# Patient Record
Sex: Male | Born: 1975
Health system: Southern US, Community
[De-identification: ages and names within clinical notes are randomized; demographics above are authoritative.]

## PROBLEM LIST (undated history)

## (undated) DIAGNOSIS — IMO0002 Reserved for concepts with insufficient information to code with codable children: Secondary | ICD-10-CM

## (undated) DIAGNOSIS — K219 Gastro-esophageal reflux disease without esophagitis: Secondary | ICD-10-CM

## (undated) HISTORY — DX: Gastro-esophageal reflux disease without esophagitis: K21.9

## (undated) HISTORY — DX: Reserved for concepts with insufficient information to code with codable children: IMO0002

---

## 2008-12-07 ENCOUNTER — Ambulatory Visit (HOSPITAL_COMMUNITY): Admission: RE | Admit: 2008-12-07 | Discharge: 2008-12-07 | Payer: Self-pay | Admitting: Plastic Surgery

## 2010-09-03 ENCOUNTER — Encounter: Payer: Self-pay | Admitting: Plastic Surgery

## 2010-11-22 LAB — BODY FLUID CULTURE
Culture: NO GROWTH
Culture: NO GROWTH

## 2013-10-16 ENCOUNTER — Other Ambulatory Visit: Payer: Self-pay | Admitting: Family

## 2013-10-16 ENCOUNTER — Ambulatory Visit (INDEPENDENT_AMBULATORY_CARE_PROVIDER_SITE_OTHER): Payer: Self-pay | Admitting: Family

## 2013-10-16 ENCOUNTER — Encounter: Payer: Self-pay | Admitting: Family

## 2013-10-16 ENCOUNTER — Telehealth: Payer: Self-pay | Admitting: Family Medicine

## 2013-10-16 VITALS — BP 104/80 | HR 69 | Ht 68.0 in | Wt 200.0 lb

## 2013-10-16 DIAGNOSIS — K219 Gastro-esophageal reflux disease without esophagitis: Secondary | ICD-10-CM

## 2013-10-16 DIAGNOSIS — R195 Other fecal abnormalities: Secondary | ICD-10-CM

## 2013-10-16 DIAGNOSIS — A088 Other specified intestinal infections: Secondary | ICD-10-CM

## 2013-10-16 DIAGNOSIS — A084 Viral intestinal infection, unspecified: Secondary | ICD-10-CM

## 2013-10-16 LAB — CBC WITH DIFFERENTIAL/PLATELET
BASOS ABS: 0 10*3/uL (ref 0.0–0.1)
Basophils Relative: 0.6 % (ref 0.0–3.0)
EOS PCT: 3.8 % (ref 0.0–5.0)
Eosinophils Absolute: 0.2 10*3/uL (ref 0.0–0.7)
HCT: 43.2 % (ref 39.0–52.0)
Hemoglobin: 14.7 g/dL (ref 13.0–17.0)
LYMPHS ABS: 1.6 10*3/uL (ref 0.7–4.0)
Lymphocytes Relative: 34.5 % (ref 12.0–46.0)
MCHC: 34 g/dL (ref 30.0–36.0)
MCV: 84.2 fl (ref 78.0–100.0)
MONOS PCT: 9.5 % (ref 3.0–12.0)
Monocytes Absolute: 0.4 10*3/uL (ref 0.1–1.0)
NEUTROS PCT: 51.6 % (ref 43.0–77.0)
Neutro Abs: 2.4 10*3/uL (ref 1.4–7.7)
PLATELETS: 261 10*3/uL (ref 150.0–400.0)
RBC: 5.13 Mil/uL (ref 4.22–5.81)
RDW: 12.8 % (ref 11.5–14.6)
WBC: 4.6 10*3/uL (ref 4.5–10.5)

## 2013-10-16 LAB — COMPREHENSIVE METABOLIC PANEL
ALBUMIN: 4.1 g/dL (ref 3.5–5.2)
ALK PHOS: 50 U/L (ref 39–117)
ALT: 38 U/L (ref 0–53)
AST: 28 U/L (ref 0–37)
BILIRUBIN TOTAL: 0.6 mg/dL (ref 0.3–1.2)
BUN: 13 mg/dL (ref 6–23)
CO2: 28 meq/L (ref 19–32)
Calcium: 9.3 mg/dL (ref 8.4–10.5)
Chloride: 104 mEq/L (ref 96–112)
Creatinine, Ser: 1.2 mg/dL (ref 0.4–1.5)
GFR: 74.27 mL/min (ref >60.00)
GLUCOSE: 97 mg/dL (ref 70–99)
POTASSIUM: 3.9 meq/L (ref 3.5–5.1)
SODIUM: 138 meq/L (ref 135–145)
TOTAL PROTEIN: 7 g/dL (ref 6.0–8.3)

## 2013-10-16 NOTE — Patient Instructions (Signed)

## 2013-10-16 NOTE — Telephone Encounter (Signed)
Spoke to pt c/o white stools x 4 days, thinks maybe gallbladder and wanted to know if labs could be done prior to appointment today. Told pt being as new pt need to be evaluated first and then labs will be ordered accordingly. Pt verbalized understanding.

## 2013-10-16 NOTE — Progress Notes (Signed)
   Subjective:    Patient ID: Austin LowensteinJonathan A Rosario, male    DOB: Apr 22, 1976, 38 y.o.   MRN: [id number]  HPI 38 year old white male, nonsmoker, the patient to the practice is in with complaints of white stools x4-5 days. He also has symptoms of nausea, low-grade fever, vomiting, diarrhea that has since resolved. Report having heartburn and indigestion that dates back to January for which he takes, 5734 and chart it's really help. Report feeling bloated after eating and drinking milk. Has a history of duodenal ulcers in the past.   Review of Systems  Constitutional: Negative.   HENT: Negative.   Respiratory: Negative.   Cardiovascular: Negative.   Gastrointestinal: Positive for nausea, vomiting, abdominal pain and abdominal distention.       White stool   Genitourinary: Negative.   Musculoskeletal: Negative.   Skin: Negative.   Allergic/Immunologic: Negative.   Neurological: Negative.   Psychiatric/Behavioral: Negative.    History reviewed. No pertinent past medical history.  History   Social History  . Marital Status: Married    Spouse Name: N/A    Number of Children: N/A  . Years of Education: N/A   Occupational History  . Not on file.   Social History Main Topics  . Smoking status: Never Smoker   . Smokeless tobacco: Not on file  . Alcohol Use: Yes  . Drug Use: No  . Sexual Activity: Not on file   Other Topics Concern  . Not on file   Social History Narrative  . No narrative on file    History reviewed. No pertinent past surgical history.  No family history on file.  Not on File  No current outpatient prescriptions on file prior to visit.   No current facility-administered medications on file prior to visit.    BP 104/80  Pulse 69  Ht 5\' 8"  (1.727 m)  Wt 200 lb (90.719 kg)  BMI 30.42 kg/m2chartj   Objective:   Physical Exam  Constitutional: He is oriented to person, place, and time. He appears well-developed and well-nourished.  HENT:  Right Ear:  External ear normal.  Left Ear: External ear normal.  Nose: Nose normal.  Mouth/Throat: Oropharynx is clear and moist.  Neck: Normal range of motion. Neck supple.  Cardiovascular: Normal rate, regular rhythm and normal heart sounds.   Pulmonary/Chest: Effort normal and breath sounds normal.  Abdominal: He exhibits distension. There is no tenderness. There is no rebound and no guarding.  Musculoskeletal: Normal range of motion.  Neurological: He is alert and oriented to person, place, and time.  Skin: Skin is warm and dry.  Psychiatric: He has a normal mood and affect.          Assessment & Plan:  Austin Rosario was seen today for stool color change.  Diagnoses and associated orders for this visit:  Nonspecific abnormal finding in stool contents - CBC with Differential - CMP  GERD (gastroesophageal reflux disease) - CBC with Differential - CMP  Viral gastroenteritis - CBC with Differential - CMP   Prilosec OTC once daily. Consider ultrasound of RUQ pending the results of the labs.

## 2013-10-16 NOTE — Progress Notes (Signed)
Pre visit review using our clinic review tool, if applicable. No additional management support is needed unless otherwise documented below in the visit note. 

## 2013-10-16 NOTE — Telephone Encounter (Signed)
Patient called in regarding GI issues and abnormal colored bowel movements. Reports he has not been seen in office yet. Transferred caller to office nurse since he has not been seen in office previously.

## 2013-10-20 ENCOUNTER — Encounter: Payer: Self-pay | Admitting: Gastroenterology

## 2013-10-23 ENCOUNTER — Ambulatory Visit (INDEPENDENT_AMBULATORY_CARE_PROVIDER_SITE_OTHER): Payer: Self-pay | Admitting: Family

## 2013-10-23 ENCOUNTER — Encounter: Payer: Self-pay | Admitting: Family

## 2013-10-23 VITALS — BP 100/60 | HR 65 | Ht 68.0 in | Wt 200.0 lb

## 2013-10-23 DIAGNOSIS — K219 Gastro-esophageal reflux disease without esophagitis: Secondary | ICD-10-CM

## 2013-10-23 NOTE — Progress Notes (Signed)
   Subjective:    Patient ID: Austin Rosario, male    DOB: 15-Jan-1976, 38 y.o.   MRN: [id number]  HPI 38 year old white male, nonsmoker is in today to establish. He was seen for an acute visit with complaint of white stools that since resolved. He followup visit gastroenterology on April 21. Reports occasional abdominal tenderness; of the pain in but overall is significantly improved as he maintains a low fat diet. Denies any heartburn indigestion currently.   Review of Systems  Constitutional: Negative.   HENT: Negative.   Respiratory: Negative.   Cardiovascular: Negative.   Gastrointestinal: Negative for abdominal pain, blood in stool and abdominal distention.  Genitourinary: Negative.   Musculoskeletal: Negative.   Skin: Negative.   Psychiatric/Behavioral: Negative.    Past Medical History  Diagnosis Date  . GERD (gastroesophageal reflux disease)   . Ulcer     History   Social History  . Marital Status: Married    Spouse Name: N/A    Number of Children: N/A  . Years of Education: N/A   Occupational History  . Not on file.   Social History Main Topics  . Smoking status: Never Smoker   . Smokeless tobacco: Not on file  . Alcohol Use: Yes  . Drug Use: No  . Sexual Activity: Not on file   Other Topics Concern  . Not on file   Social History Narrative  . No narrative on file    History reviewed. No pertinent past surgical history.  Family History  Problem Relation Age of Onset  . Breast cancer Maternal Grandmother   . Alcohol abuse Paternal Grandfather   . Colon cancer Paternal Grandfather     Not on File  No current outpatient prescriptions on file prior to visit.   No current facility-administered medications on file prior to visit.    BP 100/60  Pulse 65  Ht 5\' 8"  (1.727 m)  Wt 200 lb (90.719 kg)  BMI 30.42 kg/m2chart    Objective:   Physical Exam  Constitutional: He is oriented to person, place, and time. He appears well-developed and  well-nourished.  HENT:  Right Ear: External ear normal.  Left Ear: External ear normal.  Nose: Nose normal.  Mouth/Throat: Oropharynx is clear and moist.  Neck: Normal range of motion. Neck supple.  Cardiovascular: Normal rate, regular rhythm and normal heart sounds.   Pulmonary/Chest: Effort normal and breath sounds normal.  Abdominal: Soft. Bowel sounds are normal. There is no tenderness.  Neurological: He is alert and oriented to person, place, and time.  Skin: Skin is warm and dry.  Psychiatric: He has a normal mood and affect.          Assessment & Plan:  Austin Rosario was seen today for no specified reason.  Diagnoses and associated orders for this visit:  GERD (gastroesophageal reflux disease)   Continue low-fat diet  Offered an ultrasound of the right upper quadrant. Patient declined and would like to wait till next month when New insurance is benefits began.

## 2013-10-23 NOTE — Patient Instructions (Signed)

## 2013-10-23 NOTE — Progress Notes (Signed)
Pre visit review using our clinic review tool, if applicable. No additional management support is needed unless otherwise documented below in the visit note. 

## 2013-12-01 ENCOUNTER — Ambulatory Visit (INDEPENDENT_AMBULATORY_CARE_PROVIDER_SITE_OTHER): Payer: BC Managed Care – PPO | Admitting: Gastroenterology

## 2013-12-01 ENCOUNTER — Encounter: Payer: Self-pay | Admitting: Gastroenterology

## 2013-12-01 VITALS — BP 114/80 | HR 80 | Ht 68.0 in | Wt 199.8 lb

## 2013-12-01 DIAGNOSIS — R1013 Epigastric pain: Secondary | ICD-10-CM

## 2013-12-01 DIAGNOSIS — R109 Unspecified abdominal pain: Secondary | ICD-10-CM

## 2013-12-01 MED ORDER — ESOMEPRAZOLE MAGNESIUM 40 MG PO CPDR: DELAYED_RELEASE_CAPSULE | ORAL | Status: DC

## 2013-12-01 NOTE — Progress Notes (Signed)
    _                                                                                                                History of Present Illness: 38 year old white male referred for evaluation of abdominal and chest discomfort.  Approximately 6 weeks ago he had what sounds like a gastroenteritis characterized by nausea, vomiting and diarrhea.  This was followed by several days of a very light colored stool.  Liver tests at that time were normal.  He denied having abdominal pain.  He's had no recurrences.  At no time was he jaundiced or have dark-colored urine.  He does occasionally have gas-like pressure in his lower chest and upper abdomen which is relieved by belching.  He denies pyrosis, per se.  There's been no recent change in bowel habits, melena or hematochezia.    Past Medical History  Diagnosis Date  . GERD (gastroesophageal reflux disease)   . Ulcer    History reviewed. No pertinent past surgical history. family history includes Alcohol abuse in his paternal grandfather; Breast cancer in his maternal grandmother; Colon cancer in his paternal grandfather. Current Outpatient Prescriptions  Medication Sig Dispense Refill  . loratadine (CLARITIN) 10 MG tablet Take 10 mg by mouth daily.       No current facility-administered medications for this visit.   Allergies as of 12/01/2013  . (No Known Allergies)    reports that he has never smoked. He has never used smokeless tobacco. He reports that he drinks alcohol. He reports that he does not use illicit drugs.     Review of Systems: Pertinent positive and negative review of systems were noted in the above HPI section. All other review of systems were otherwise negative.  Vital signs were reviewed in today's medical record Physical Exam: General: Well developed , well nourished, no acute distress Skin: anicteric Head: Normocephalic and atraumatic Eyes:  sclerae anicteric, EOMI Ears: Normal auditory acuity Mouth: No  deformity or lesions Neck: Supple, no masses or thyromegaly Lungs: Clear throughout to auscultation Heart: Regular rate and rhythm; no murmurs, rubs or bruits Abdomen: Soft, non tender and non distended. No masses, hepatosplenomegaly or hernias noted. Normal Bowel sounds.  There is a movable subcutaneous 3 x 4 cm mass in the left upper quadrant (patient had liposuction previously) Rectal:deferred Musculoskeletal: Symmetrical with no gross deformities  Skin: No lesions on visible extremities Pulses:  Normal pulses noted Extremities: No clubbing, cyanosis, edema or deformities noted Neurological: Alert oriented x 4, grossly nonfocal Cervical Nodes:  No significant cervical adenopathy Inguinal Nodes: No significant inguinal adenopathy Psychological:  Alert and cooperative. Normal mood and affect  See Assessment and Plan under Problem List

## 2013-12-01 NOTE — Patient Instructions (Signed)
You have been scheduled for an abdominal [facility] of hospital) on 12/04/2013 at 9:30am. Please arrive 15 minutes prior to your appointment for registration. Make certain not to have anything to eat or drink 6 hours prior to your appointment. Should you need to reschedule your appointment, please contact radiology at [phone]. This test typically takes about 30 minutes to perform. Nexium samples

## 2013-12-01 NOTE — Assessment & Plan Note (Signed)
Patient has what best can be described as episodes of fullness and pressure in his upper abdomen and lower chest that is nonexertional and probably represents trapped gas or acid reflux.  Symptoms do not suggest biliary tract disease and LFTs at the time of his discolored stools were normal.  Recommendations #1 trial of Nexium 40 mg daily #2 should stools become discolored we will recheck LFTs #3 abdominal ultrasound

## 2013-12-04 ENCOUNTER — Ambulatory Visit (HOSPITAL_COMMUNITY)
Admission: RE | Admit: 2013-12-04 | Discharge: 2013-12-04 | Disposition: A | Discharge status: Home / Self Care | Payer: BC Managed Care – PPO | Source: Ambulatory Visit | Attending: Gastroenterology | Admitting: Gastroenterology

## 2013-12-04 DIAGNOSIS — R109 Unspecified abdominal pain: Secondary | ICD-10-CM

## 2013-12-04 NOTE — Progress Notes (Signed)
Quick Note:  Please inform the patient that ultrasound was normal and to continue current plan of action ______ 

## 2013-12-21 ENCOUNTER — Encounter: Payer: Self-pay | Admitting: Gastroenterology

## 2013-12-21 NOTE — Telephone Encounter (Signed)
error 

## 2013-12-22 ENCOUNTER — Ambulatory Visit: Payer: BC Managed Care – PPO | Admitting: Gastroenterology

## 2014-02-17 ENCOUNTER — Ambulatory Visit: Payer: BC Managed Care – PPO | Admitting: Gastroenterology

## 2014-03-08 ENCOUNTER — Other Ambulatory Visit: Payer: Self-pay | Admitting: Gastroenterology

## 2014-03-26 ENCOUNTER — Ambulatory Visit: Payer: BC Managed Care – PPO | Admitting: Family

## 2014-03-26 DIAGNOSIS — Z0289 Encounter for other administrative examinations: Secondary | ICD-10-CM

## 2014-04-28 ENCOUNTER — Ambulatory Visit: Payer: BC Managed Care – PPO | Admitting: Gastroenterology

## 2014-06-06 ENCOUNTER — Other Ambulatory Visit: Payer: Self-pay | Admitting: Gastroenterology

## 2016-10-08 DIAGNOSIS — M9902 Segmental and somatic dysfunction of thoracic region: Secondary | ICD-10-CM | POA: Diagnosis not present

## 2016-10-08 DIAGNOSIS — M9903 Segmental and somatic dysfunction of lumbar region: Secondary | ICD-10-CM | POA: Diagnosis not present

## 2016-10-08 DIAGNOSIS — M50123 Cervical disc disorder at C6-C7 level with radiculopathy: Secondary | ICD-10-CM | POA: Diagnosis not present

## 2016-10-08 DIAGNOSIS — M9901 Segmental and somatic dysfunction of cervical region: Secondary | ICD-10-CM | POA: Diagnosis not present

## 2016-11-02 DIAGNOSIS — G4733 Obstructive sleep apnea (adult) (pediatric): Secondary | ICD-10-CM | POA: Diagnosis not present

## 2016-11-20 DIAGNOSIS — G4733 Obstructive sleep apnea (adult) (pediatric): Secondary | ICD-10-CM | POA: Diagnosis not present

## 2016-11-26 DIAGNOSIS — G4733 Obstructive sleep apnea (adult) (pediatric): Secondary | ICD-10-CM | POA: Diagnosis not present

## 2016-12-07 DIAGNOSIS — M9902 Segmental and somatic dysfunction of thoracic region: Secondary | ICD-10-CM | POA: Diagnosis not present

## 2016-12-07 DIAGNOSIS — M50123 Cervical disc disorder at C6-C7 level with radiculopathy: Secondary | ICD-10-CM | POA: Diagnosis not present

## 2016-12-07 DIAGNOSIS — M9901 Segmental and somatic dysfunction of cervical region: Secondary | ICD-10-CM | POA: Diagnosis not present

## 2016-12-07 DIAGNOSIS — M9903 Segmental and somatic dysfunction of lumbar region: Secondary | ICD-10-CM | POA: Diagnosis not present

## 2017-01-21 DIAGNOSIS — H0279 Other degenerative disorders of eyelid and periocular area: Secondary | ICD-10-CM | POA: Diagnosis not present

## 2017-01-21 DIAGNOSIS — H53483 Generalized contraction of visual field, bilateral: Secondary | ICD-10-CM | POA: Diagnosis not present

## 2017-01-21 DIAGNOSIS — H02831 Dermatochalasis of right upper eyelid: Secondary | ICD-10-CM | POA: Diagnosis not present

## 2017-01-21 DIAGNOSIS — H02413 Mechanical ptosis of bilateral eyelids: Secondary | ICD-10-CM | POA: Diagnosis not present

## 2017-01-21 DIAGNOSIS — H02834 Dermatochalasis of left upper eyelid: Secondary | ICD-10-CM | POA: Diagnosis not present

## 2017-03-25 DIAGNOSIS — K219 Gastro-esophageal reflux disease without esophagitis: Secondary | ICD-10-CM | POA: Diagnosis not present

## 2017-03-25 DIAGNOSIS — J208 Acute bronchitis due to other specified organisms: Secondary | ICD-10-CM | POA: Diagnosis not present

## 2017-03-25 DIAGNOSIS — K582 Mixed irritable bowel syndrome: Secondary | ICD-10-CM | POA: Diagnosis not present

## 2017-04-11 DIAGNOSIS — R05 Cough: Secondary | ICD-10-CM | POA: Diagnosis not present

## 2017-04-11 DIAGNOSIS — J3089 Other allergic rhinitis: Secondary | ICD-10-CM | POA: Diagnosis not present

## 2017-04-11 DIAGNOSIS — J3081 Allergic rhinitis due to animal (cat) (dog) hair and dander: Secondary | ICD-10-CM | POA: Diagnosis not present

## 2017-04-11 DIAGNOSIS — Z91018 Allergy to other foods: Secondary | ICD-10-CM | POA: Diagnosis not present

## 2017-04-11 DIAGNOSIS — J301 Allergic rhinitis due to pollen: Secondary | ICD-10-CM | POA: Diagnosis not present

## 2017-04-16 DIAGNOSIS — Z91018 Allergy to other foods: Secondary | ICD-10-CM | POA: Diagnosis not present

## 2017-04-21 DIAGNOSIS — J3089 Other allergic rhinitis: Secondary | ICD-10-CM | POA: Diagnosis not present

## 2017-04-21 DIAGNOSIS — J301 Allergic rhinitis due to pollen: Secondary | ICD-10-CM | POA: Diagnosis not present

## 2017-04-21 DIAGNOSIS — J3081 Allergic rhinitis due to animal (cat) (dog) hair and dander: Secondary | ICD-10-CM | POA: Diagnosis not present

## 2017-04-22 DIAGNOSIS — K219 Gastro-esophageal reflux disease without esophagitis: Secondary | ICD-10-CM | POA: Diagnosis not present

## 2017-04-22 DIAGNOSIS — Z91018 Allergy to other foods: Secondary | ICD-10-CM | POA: Diagnosis not present

## 2017-04-22 DIAGNOSIS — E349 Endocrine disorder, unspecified: Secondary | ICD-10-CM | POA: Diagnosis not present

## 2017-04-22 DIAGNOSIS — Z0000 Encounter for general adult medical examination without abnormal findings: Secondary | ICD-10-CM | POA: Diagnosis not present

## 2017-04-22 DIAGNOSIS — E739 Lactose intolerance, unspecified: Secondary | ICD-10-CM | POA: Diagnosis not present

## 2017-04-30 DIAGNOSIS — J3089 Other allergic rhinitis: Secondary | ICD-10-CM | POA: Diagnosis not present

## 2017-04-30 DIAGNOSIS — J3081 Allergic rhinitis due to animal (cat) (dog) hair and dander: Secondary | ICD-10-CM | POA: Diagnosis not present

## 2017-04-30 DIAGNOSIS — J301 Allergic rhinitis due to pollen: Secondary | ICD-10-CM | POA: Diagnosis not present

## 2017-05-02 DIAGNOSIS — J3081 Allergic rhinitis due to animal (cat) (dog) hair and dander: Secondary | ICD-10-CM | POA: Diagnosis not present

## 2017-05-02 DIAGNOSIS — J301 Allergic rhinitis due to pollen: Secondary | ICD-10-CM | POA: Diagnosis not present

## 2017-05-02 DIAGNOSIS — J3089 Other allergic rhinitis: Secondary | ICD-10-CM | POA: Diagnosis not present

## 2017-05-08 DIAGNOSIS — J3081 Allergic rhinitis due to animal (cat) (dog) hair and dander: Secondary | ICD-10-CM | POA: Diagnosis not present

## 2017-05-08 DIAGNOSIS — J3089 Other allergic rhinitis: Secondary | ICD-10-CM | POA: Diagnosis not present

## 2017-05-08 DIAGNOSIS — J301 Allergic rhinitis due to pollen: Secondary | ICD-10-CM | POA: Diagnosis not present

## 2017-05-09 DIAGNOSIS — R194 Change in bowel habit: Secondary | ICD-10-CM | POA: Diagnosis not present

## 2017-05-14 DIAGNOSIS — J301 Allergic rhinitis due to pollen: Secondary | ICD-10-CM | POA: Diagnosis not present

## 2017-05-14 DIAGNOSIS — J3089 Other allergic rhinitis: Secondary | ICD-10-CM | POA: Diagnosis not present

## 2017-05-14 DIAGNOSIS — J3081 Allergic rhinitis due to animal (cat) (dog) hair and dander: Secondary | ICD-10-CM | POA: Diagnosis not present

## 2017-06-03 DIAGNOSIS — J301 Allergic rhinitis due to pollen: Secondary | ICD-10-CM | POA: Diagnosis not present

## 2017-06-03 DIAGNOSIS — J3081 Allergic rhinitis due to animal (cat) (dog) hair and dander: Secondary | ICD-10-CM | POA: Diagnosis not present

## 2017-06-03 DIAGNOSIS — J3089 Other allergic rhinitis: Secondary | ICD-10-CM | POA: Diagnosis not present

## 2017-06-06 DIAGNOSIS — Z302 Encounter for sterilization: Secondary | ICD-10-CM | POA: Diagnosis not present

## 2017-06-10 DIAGNOSIS — J3089 Other allergic rhinitis: Secondary | ICD-10-CM | POA: Diagnosis not present

## 2017-06-10 DIAGNOSIS — J301 Allergic rhinitis due to pollen: Secondary | ICD-10-CM | POA: Diagnosis not present

## 2017-06-10 DIAGNOSIS — J3081 Allergic rhinitis due to animal (cat) (dog) hair and dander: Secondary | ICD-10-CM | POA: Diagnosis not present

## 2017-06-17 DIAGNOSIS — J3081 Allergic rhinitis due to animal (cat) (dog) hair and dander: Secondary | ICD-10-CM | POA: Diagnosis not present

## 2017-06-17 DIAGNOSIS — J301 Allergic rhinitis due to pollen: Secondary | ICD-10-CM | POA: Diagnosis not present

## 2017-06-17 DIAGNOSIS — J3089 Other allergic rhinitis: Secondary | ICD-10-CM | POA: Diagnosis not present

## 2017-06-19 DIAGNOSIS — Z23 Encounter for immunization: Secondary | ICD-10-CM | POA: Diagnosis not present

## 2017-06-20 DIAGNOSIS — J3081 Allergic rhinitis due to animal (cat) (dog) hair and dander: Secondary | ICD-10-CM | POA: Diagnosis not present

## 2017-06-20 DIAGNOSIS — J301 Allergic rhinitis due to pollen: Secondary | ICD-10-CM | POA: Diagnosis not present

## 2017-06-20 DIAGNOSIS — J3089 Other allergic rhinitis: Secondary | ICD-10-CM | POA: Diagnosis not present

## 2017-06-25 DIAGNOSIS — J3081 Allergic rhinitis due to animal (cat) (dog) hair and dander: Secondary | ICD-10-CM | POA: Diagnosis not present

## 2017-06-25 DIAGNOSIS — J301 Allergic rhinitis due to pollen: Secondary | ICD-10-CM | POA: Diagnosis not present

## 2017-06-25 DIAGNOSIS — J3089 Other allergic rhinitis: Secondary | ICD-10-CM | POA: Diagnosis not present

## 2017-07-08 DIAGNOSIS — J3089 Other allergic rhinitis: Secondary | ICD-10-CM | POA: Diagnosis not present

## 2017-07-08 DIAGNOSIS — J301 Allergic rhinitis due to pollen: Secondary | ICD-10-CM | POA: Diagnosis not present

## 2017-07-08 DIAGNOSIS — J3081 Allergic rhinitis due to animal (cat) (dog) hair and dander: Secondary | ICD-10-CM | POA: Diagnosis not present

## 2017-07-16 DIAGNOSIS — J301 Allergic rhinitis due to pollen: Secondary | ICD-10-CM | POA: Diagnosis not present

## 2017-07-16 DIAGNOSIS — J3081 Allergic rhinitis due to animal (cat) (dog) hair and dander: Secondary | ICD-10-CM | POA: Diagnosis not present

## 2017-07-16 DIAGNOSIS — J3089 Other allergic rhinitis: Secondary | ICD-10-CM | POA: Diagnosis not present

## 2017-07-18 DIAGNOSIS — J301 Allergic rhinitis due to pollen: Secondary | ICD-10-CM | POA: Diagnosis not present

## 2017-07-18 DIAGNOSIS — J3089 Other allergic rhinitis: Secondary | ICD-10-CM | POA: Diagnosis not present

## 2017-07-18 DIAGNOSIS — J3081 Allergic rhinitis due to animal (cat) (dog) hair and dander: Secondary | ICD-10-CM | POA: Diagnosis not present

## 2017-07-23 DIAGNOSIS — J301 Allergic rhinitis due to pollen: Secondary | ICD-10-CM | POA: Diagnosis not present

## 2017-07-23 DIAGNOSIS — J3089 Other allergic rhinitis: Secondary | ICD-10-CM | POA: Diagnosis not present

## 2017-07-23 DIAGNOSIS — J3081 Allergic rhinitis due to animal (cat) (dog) hair and dander: Secondary | ICD-10-CM | POA: Diagnosis not present

## 2017-07-25 DIAGNOSIS — J3089 Other allergic rhinitis: Secondary | ICD-10-CM | POA: Diagnosis not present

## 2017-07-25 DIAGNOSIS — J301 Allergic rhinitis due to pollen: Secondary | ICD-10-CM | POA: Diagnosis not present

## 2017-07-25 DIAGNOSIS — J3081 Allergic rhinitis due to animal (cat) (dog) hair and dander: Secondary | ICD-10-CM | POA: Diagnosis not present

## 2017-07-29 DIAGNOSIS — J3081 Allergic rhinitis due to animal (cat) (dog) hair and dander: Secondary | ICD-10-CM | POA: Diagnosis not present

## 2017-07-29 DIAGNOSIS — J3089 Other allergic rhinitis: Secondary | ICD-10-CM | POA: Diagnosis not present

## 2017-07-29 DIAGNOSIS — J301 Allergic rhinitis due to pollen: Secondary | ICD-10-CM | POA: Diagnosis not present

## 2017-08-08 DIAGNOSIS — J3089 Other allergic rhinitis: Secondary | ICD-10-CM | POA: Diagnosis not present

## 2017-08-08 DIAGNOSIS — J301 Allergic rhinitis due to pollen: Secondary | ICD-10-CM | POA: Diagnosis not present

## 2017-08-08 DIAGNOSIS — J3081 Allergic rhinitis due to animal (cat) (dog) hair and dander: Secondary | ICD-10-CM | POA: Diagnosis not present

## 2017-08-15 DIAGNOSIS — J301 Allergic rhinitis due to pollen: Secondary | ICD-10-CM | POA: Diagnosis not present

## 2017-08-15 DIAGNOSIS — J3089 Other allergic rhinitis: Secondary | ICD-10-CM | POA: Diagnosis not present

## 2017-08-15 DIAGNOSIS — J3081 Allergic rhinitis due to animal (cat) (dog) hair and dander: Secondary | ICD-10-CM | POA: Diagnosis not present

## 2017-08-20 DIAGNOSIS — J3089 Other allergic rhinitis: Secondary | ICD-10-CM | POA: Diagnosis not present

## 2017-08-20 DIAGNOSIS — J3081 Allergic rhinitis due to animal (cat) (dog) hair and dander: Secondary | ICD-10-CM | POA: Diagnosis not present

## 2017-08-20 DIAGNOSIS — J301 Allergic rhinitis due to pollen: Secondary | ICD-10-CM | POA: Diagnosis not present

## 2017-08-22 DIAGNOSIS — J3081 Allergic rhinitis due to animal (cat) (dog) hair and dander: Secondary | ICD-10-CM | POA: Diagnosis not present

## 2017-08-22 DIAGNOSIS — J3089 Other allergic rhinitis: Secondary | ICD-10-CM | POA: Diagnosis not present

## 2017-08-22 DIAGNOSIS — J301 Allergic rhinitis due to pollen: Secondary | ICD-10-CM | POA: Diagnosis not present

## 2017-08-29 DIAGNOSIS — J3081 Allergic rhinitis due to animal (cat) (dog) hair and dander: Secondary | ICD-10-CM | POA: Diagnosis not present

## 2017-08-29 DIAGNOSIS — J301 Allergic rhinitis due to pollen: Secondary | ICD-10-CM | POA: Diagnosis not present

## 2017-08-29 DIAGNOSIS — J3089 Other allergic rhinitis: Secondary | ICD-10-CM | POA: Diagnosis not present

## 2017-09-04 DIAGNOSIS — J0140 Acute pansinusitis, unspecified: Secondary | ICD-10-CM | POA: Diagnosis not present

## 2017-09-12 DIAGNOSIS — J301 Allergic rhinitis due to pollen: Secondary | ICD-10-CM | POA: Diagnosis not present

## 2017-09-12 DIAGNOSIS — J3081 Allergic rhinitis due to animal (cat) (dog) hair and dander: Secondary | ICD-10-CM | POA: Diagnosis not present

## 2017-09-12 DIAGNOSIS — J3089 Other allergic rhinitis: Secondary | ICD-10-CM | POA: Diagnosis not present

## 2017-09-17 DIAGNOSIS — F411 Generalized anxiety disorder: Secondary | ICD-10-CM | POA: Diagnosis not present

## 2017-09-17 DIAGNOSIS — D751 Secondary polycythemia: Secondary | ICD-10-CM | POA: Diagnosis not present

## 2017-09-17 DIAGNOSIS — E349 Endocrine disorder, unspecified: Secondary | ICD-10-CM | POA: Diagnosis not present

## 2017-09-19 DIAGNOSIS — J301 Allergic rhinitis due to pollen: Secondary | ICD-10-CM | POA: Diagnosis not present

## 2017-09-19 DIAGNOSIS — J3081 Allergic rhinitis due to animal (cat) (dog) hair and dander: Secondary | ICD-10-CM | POA: Diagnosis not present

## 2017-09-19 DIAGNOSIS — J3089 Other allergic rhinitis: Secondary | ICD-10-CM | POA: Diagnosis not present

## 2017-09-20 DIAGNOSIS — E349 Endocrine disorder, unspecified: Secondary | ICD-10-CM | POA: Diagnosis not present

## 2017-09-24 DIAGNOSIS — J3089 Other allergic rhinitis: Secondary | ICD-10-CM | POA: Diagnosis not present

## 2017-09-24 DIAGNOSIS — J301 Allergic rhinitis due to pollen: Secondary | ICD-10-CM | POA: Diagnosis not present

## 2017-09-24 DIAGNOSIS — J3081 Allergic rhinitis due to animal (cat) (dog) hair and dander: Secondary | ICD-10-CM | POA: Diagnosis not present

## 2017-10-01 DIAGNOSIS — J3089 Other allergic rhinitis: Secondary | ICD-10-CM | POA: Diagnosis not present

## 2017-10-01 DIAGNOSIS — J3081 Allergic rhinitis due to animal (cat) (dog) hair and dander: Secondary | ICD-10-CM | POA: Diagnosis not present

## 2017-10-01 DIAGNOSIS — J301 Allergic rhinitis due to pollen: Secondary | ICD-10-CM | POA: Diagnosis not present

## 2017-10-09 DIAGNOSIS — J301 Allergic rhinitis due to pollen: Secondary | ICD-10-CM | POA: Diagnosis not present

## 2017-10-09 DIAGNOSIS — J3089 Other allergic rhinitis: Secondary | ICD-10-CM | POA: Diagnosis not present

## 2017-10-09 DIAGNOSIS — J3081 Allergic rhinitis due to animal (cat) (dog) hair and dander: Secondary | ICD-10-CM | POA: Diagnosis not present

## 2017-10-10 DIAGNOSIS — R1084 Generalized abdominal pain: Secondary | ICD-10-CM | POA: Diagnosis not present

## 2017-10-10 DIAGNOSIS — K589 Irritable bowel syndrome without diarrhea: Secondary | ICD-10-CM | POA: Diagnosis not present

## 2017-10-16 DIAGNOSIS — J301 Allergic rhinitis due to pollen: Secondary | ICD-10-CM | POA: Diagnosis not present

## 2017-10-16 DIAGNOSIS — J3089 Other allergic rhinitis: Secondary | ICD-10-CM | POA: Diagnosis not present

## 2017-10-16 DIAGNOSIS — J3081 Allergic rhinitis due to animal (cat) (dog) hair and dander: Secondary | ICD-10-CM | POA: Diagnosis not present

## 2017-10-18 DIAGNOSIS — F411 Generalized anxiety disorder: Secondary | ICD-10-CM | POA: Diagnosis not present

## 2017-10-24 DIAGNOSIS — J0100 Acute maxillary sinusitis, unspecified: Secondary | ICD-10-CM | POA: Diagnosis not present

## 2017-10-29 DIAGNOSIS — J3089 Other allergic rhinitis: Secondary | ICD-10-CM | POA: Diagnosis not present

## 2017-10-29 DIAGNOSIS — J301 Allergic rhinitis due to pollen: Secondary | ICD-10-CM | POA: Diagnosis not present

## 2017-10-29 DIAGNOSIS — J3081 Allergic rhinitis due to animal (cat) (dog) hair and dander: Secondary | ICD-10-CM | POA: Diagnosis not present

## 2017-11-04 DIAGNOSIS — D485 Neoplasm of uncertain behavior of skin: Secondary | ICD-10-CM | POA: Diagnosis not present

## 2017-11-04 DIAGNOSIS — L570 Actinic keratosis: Secondary | ICD-10-CM | POA: Diagnosis not present

## 2017-11-04 DIAGNOSIS — B001 Herpesviral vesicular dermatitis: Secondary | ICD-10-CM | POA: Diagnosis not present

## 2017-11-07 DIAGNOSIS — J301 Allergic rhinitis due to pollen: Secondary | ICD-10-CM | POA: Diagnosis not present

## 2017-11-07 DIAGNOSIS — J3081 Allergic rhinitis due to animal (cat) (dog) hair and dander: Secondary | ICD-10-CM | POA: Diagnosis not present

## 2017-11-07 DIAGNOSIS — J3089 Other allergic rhinitis: Secondary | ICD-10-CM | POA: Diagnosis not present

## 2017-11-13 DIAGNOSIS — J301 Allergic rhinitis due to pollen: Secondary | ICD-10-CM | POA: Diagnosis not present

## 2017-11-13 DIAGNOSIS — J3081 Allergic rhinitis due to animal (cat) (dog) hair and dander: Secondary | ICD-10-CM | POA: Diagnosis not present

## 2017-11-13 DIAGNOSIS — J3089 Other allergic rhinitis: Secondary | ICD-10-CM | POA: Diagnosis not present

## 2017-11-18 DIAGNOSIS — Z91018 Allergy to other foods: Secondary | ICD-10-CM | POA: Diagnosis not present

## 2017-11-19 DIAGNOSIS — M25512 Pain in left shoulder: Secondary | ICD-10-CM | POA: Diagnosis not present

## 2017-11-20 ENCOUNTER — Ambulatory Visit (INDEPENDENT_AMBULATORY_CARE_PROVIDER_SITE_OTHER): Payer: BLUE CROSS/BLUE SHIELD

## 2017-11-20 ENCOUNTER — Ambulatory Visit (INDEPENDENT_AMBULATORY_CARE_PROVIDER_SITE_OTHER): Payer: BLUE CROSS/BLUE SHIELD | Admitting: Orthopaedic Surgery

## 2017-11-20 ENCOUNTER — Encounter (INDEPENDENT_AMBULATORY_CARE_PROVIDER_SITE_OTHER): Payer: Self-pay | Admitting: Orthopaedic Surgery

## 2017-11-20 DIAGNOSIS — M25512 Pain in left shoulder: Secondary | ICD-10-CM

## 2017-11-20 MED ORDER — LIDOCAINE HCL 1 % IJ SOLN
3.0000 mL | INTRAMUSCULAR | Status: AC | PRN
  Administered 2017-11-20: 3 mL

## 2017-11-20 MED ORDER — METHYLPREDNISOLONE ACETATE 40 MG/ML IJ SUSP
40.0000 mg | INTRAMUSCULAR | Status: AC | PRN
  Administered 2017-11-20: 40 mg via INTRA_ARTICULAR

## 2017-11-20 NOTE — Progress Notes (Signed)
Office Visit Note   Patient: Austin Rosario           Date of Birth: [birth year]           MRN: [id number] Visit Date: 11/20/2017              Requested by: Eulis FosterWebb, Padonda B, FNP [address] [place], [place] [id number] PCP: Eulis FosterWebb, Padonda B, FNP   Assessment & Plan: Visit Diagnoses:  1. Acute pain of left shoulder     Plan: I talked to DamonJonathan in length in detail.  I need him to refrain from any heavy lifting and overhead activities for the next week to 2 weeks.  Also talked about a subacromial steroid injection in his shoulder on the left side and he agreed with this.  I explained the risk benefits injections as well.  With the lidocaine and steroid in his shoulder he was able to more easily abducted after this but I do since there is some weakness.  If after 2 weeks is not getting significant relief I like him to call because I would definitely want to obtain an MRI of his left shoulder rotator cuff tear.  He is actually going to see a physical therapist tomorrow for his shoulder as well.  All questions concerns were answered and addressed.  Follow-Up Instructions: Return if symptoms worsen or fail to improve.   Orders:  Orders Placed This Encounter  Procedures  . Large Joint Inj  . XR Shoulder Left   No orders of the defined types were placed in this encounter.     Procedures: Large Joint Inj: L subacromial bursa on 11/20/2017 10:02 AM Indications: pain and diagnostic evaluation Details: 22 G 1.5 in needle  Arthrogram: No  Medications: 3 mL lidocaine 1 %; 40 mg methylPREDNISolone acetate 40 MG/ML Outcome: tolerated well, no immediate complications Procedure, treatment alternatives, risks and benefits explained, specific risks discussed. Consent was given by the patient. Immediately prior to procedure a time out was called to verify the correct patient, procedure, equipment, support staff and site/side marked as required. Patient was prepped and draped in the usual  sterile fashion.       Clinical Data: No additional findings.   Subjective: Chief Complaint  Patient presents with  . Left Shoulder - Pain  Christiane HaJonathan comes in today with severe left acute shoulder pain.  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  He was bench pressing around 300 pounds and felt a pop in his shoulder.  He hurts in the anterior shoulder and is been very painful to abduct and lift his arm above his head since then.  He says if he just relaxes he does not have any pain at all.  He denies any pain in the chest around the pec muscle areas.  He denies any previous shoulder injury.  He denies any neck pain or numbness and tingling down his left upper extremity.  HPI  Review of Systems He currently denies any headache, chest pain, shortness of breath, fever, chills, nausea, vomiting.  Objective: Vital Signs: There were no vitals taken for this visit.  Physical Exam He is alert and oriented x3 and in no acute distress Ortho Exam Examination of his left shoulder shows a negative liftoff.  He has a significant amount of pain with abduction and pain around his deltoid muscles.  His range of motion is full.  There is no pain around the pectoralis area.  There is no bruising  or atrophy.  Specialty Comments:  No specialty comments available.  Imaging: Xr Shoulder Left  Result Date: 11/20/2017 3 views of the left shoulder show well located shoulder with no acute findings.    PMFS History: Patient Active Problem List   Diagnosis Date Noted  . Abdominal pain, epigastric 12/01/2013   Past Medical History:  Diagnosis Date  . GERD (gastroesophageal reflux disease)   . Ulcer     Family History  Problem Relation Age of Onset  . Breast cancer Maternal Grandmother   . Alcohol abuse Paternal Grandfather   . Colon cancer Paternal Grandfather     History reviewed. No pertinent surgical history. Social History   Occupational History    Employer: Manufacturing engineer    Tobacco Use  . Smoking status: Never Smoker  . Smokeless tobacco: Never Used  Substance and Sexual Activity  . Alcohol use: Yes  . Drug use: No  . Sexual activity: Not on file

## 2017-11-21 DIAGNOSIS — M25512 Pain in left shoulder: Secondary | ICD-10-CM | POA: Diagnosis not present

## 2017-11-25 DIAGNOSIS — J3089 Other allergic rhinitis: Secondary | ICD-10-CM | POA: Diagnosis not present

## 2017-11-25 DIAGNOSIS — M25512 Pain in left shoulder: Secondary | ICD-10-CM | POA: Diagnosis not present

## 2017-11-25 DIAGNOSIS — J301 Allergic rhinitis due to pollen: Secondary | ICD-10-CM | POA: Diagnosis not present

## 2017-11-25 DIAGNOSIS — J3081 Allergic rhinitis due to animal (cat) (dog) hair and dander: Secondary | ICD-10-CM | POA: Diagnosis not present

## 2017-11-27 DIAGNOSIS — M25512 Pain in left shoulder: Secondary | ICD-10-CM | POA: Diagnosis not present

## 2017-12-12 DIAGNOSIS — J3081 Allergic rhinitis due to animal (cat) (dog) hair and dander: Secondary | ICD-10-CM | POA: Diagnosis not present

## 2017-12-12 DIAGNOSIS — J301 Allergic rhinitis due to pollen: Secondary | ICD-10-CM | POA: Diagnosis not present

## 2017-12-12 DIAGNOSIS — J3089 Other allergic rhinitis: Secondary | ICD-10-CM | POA: Diagnosis not present

## 2017-12-19 DIAGNOSIS — J3081 Allergic rhinitis due to animal (cat) (dog) hair and dander: Secondary | ICD-10-CM | POA: Diagnosis not present

## 2017-12-19 DIAGNOSIS — J3089 Other allergic rhinitis: Secondary | ICD-10-CM | POA: Diagnosis not present

## 2017-12-19 DIAGNOSIS — J301 Allergic rhinitis due to pollen: Secondary | ICD-10-CM | POA: Diagnosis not present

## 2017-12-24 DIAGNOSIS — J301 Allergic rhinitis due to pollen: Secondary | ICD-10-CM | POA: Diagnosis not present

## 2017-12-24 DIAGNOSIS — J3081 Allergic rhinitis due to animal (cat) (dog) hair and dander: Secondary | ICD-10-CM | POA: Diagnosis not present

## 2017-12-24 DIAGNOSIS — J3089 Other allergic rhinitis: Secondary | ICD-10-CM | POA: Diagnosis not present

## 2018-01-01 DIAGNOSIS — J301 Allergic rhinitis due to pollen: Secondary | ICD-10-CM | POA: Diagnosis not present

## 2018-01-01 DIAGNOSIS — J3089 Other allergic rhinitis: Secondary | ICD-10-CM | POA: Diagnosis not present

## 2018-01-01 DIAGNOSIS — J3081 Allergic rhinitis due to animal (cat) (dog) hair and dander: Secondary | ICD-10-CM | POA: Diagnosis not present

## 2018-01-02 DIAGNOSIS — J3081 Allergic rhinitis due to animal (cat) (dog) hair and dander: Secondary | ICD-10-CM | POA: Diagnosis not present

## 2018-01-02 DIAGNOSIS — J301 Allergic rhinitis due to pollen: Secondary | ICD-10-CM | POA: Diagnosis not present

## 2018-01-02 DIAGNOSIS — R05 Cough: Secondary | ICD-10-CM | POA: Diagnosis not present

## 2018-01-02 DIAGNOSIS — J3089 Other allergic rhinitis: Secondary | ICD-10-CM | POA: Diagnosis not present

## 2018-01-02 DIAGNOSIS — K589 Irritable bowel syndrome without diarrhea: Secondary | ICD-10-CM | POA: Diagnosis not present

## 2018-01-02 DIAGNOSIS — Z91018 Allergy to other foods: Secondary | ICD-10-CM | POA: Diagnosis not present

## 2018-01-09 DIAGNOSIS — J3081 Allergic rhinitis due to animal (cat) (dog) hair and dander: Secondary | ICD-10-CM | POA: Diagnosis not present

## 2018-01-09 DIAGNOSIS — J3089 Other allergic rhinitis: Secondary | ICD-10-CM | POA: Diagnosis not present

## 2018-01-09 DIAGNOSIS — J301 Allergic rhinitis due to pollen: Secondary | ICD-10-CM | POA: Diagnosis not present

## 2018-01-16 DIAGNOSIS — J3089 Other allergic rhinitis: Secondary | ICD-10-CM | POA: Diagnosis not present

## 2018-01-16 DIAGNOSIS — J3081 Allergic rhinitis due to animal (cat) (dog) hair and dander: Secondary | ICD-10-CM | POA: Diagnosis not present

## 2018-01-16 DIAGNOSIS — J301 Allergic rhinitis due to pollen: Secondary | ICD-10-CM | POA: Diagnosis not present

## 2018-01-23 DIAGNOSIS — J301 Allergic rhinitis due to pollen: Secondary | ICD-10-CM | POA: Diagnosis not present

## 2018-01-23 DIAGNOSIS — J3081 Allergic rhinitis due to animal (cat) (dog) hair and dander: Secondary | ICD-10-CM | POA: Diagnosis not present

## 2018-01-23 DIAGNOSIS — J3089 Other allergic rhinitis: Secondary | ICD-10-CM | POA: Diagnosis not present

## 2018-01-29 DIAGNOSIS — K582 Mixed irritable bowel syndrome: Secondary | ICD-10-CM | POA: Diagnosis not present

## 2018-01-30 DIAGNOSIS — J301 Allergic rhinitis due to pollen: Secondary | ICD-10-CM | POA: Diagnosis not present

## 2018-01-30 DIAGNOSIS — J3089 Other allergic rhinitis: Secondary | ICD-10-CM | POA: Diagnosis not present

## 2018-01-30 DIAGNOSIS — J3081 Allergic rhinitis due to animal (cat) (dog) hair and dander: Secondary | ICD-10-CM | POA: Diagnosis not present

## 2018-02-11 ENCOUNTER — Telehealth (INDEPENDENT_AMBULATORY_CARE_PROVIDER_SITE_OTHER): Payer: Self-pay | Admitting: Orthopaedic Surgery

## 2018-02-11 ENCOUNTER — Other Ambulatory Visit (INDEPENDENT_AMBULATORY_CARE_PROVIDER_SITE_OTHER): Payer: Self-pay

## 2018-02-11 DIAGNOSIS — M25512 Pain in left shoulder: Secondary | ICD-10-CM

## 2018-02-11 NOTE — Telephone Encounter (Signed)
Patient called asked if he can be set up for an MRI. Patient said his left shoulder is still having pain in it. The number to contact patient is [phone]

## 2018-02-11 NOTE — Telephone Encounter (Signed)
Pleaser call patient on [phone]

## 2018-02-11 NOTE — Telephone Encounter (Signed)
Put in order

## 2018-02-11 NOTE — Telephone Encounter (Signed)
Patient called to state that he was told to call if he did feel any better to get a MRI scheduled per -Dr. Magnus IvanBlackman.  Please call patient to advise.

## 2018-02-12 DIAGNOSIS — J3089 Other allergic rhinitis: Secondary | ICD-10-CM | POA: Diagnosis not present

## 2018-02-12 DIAGNOSIS — J3081 Allergic rhinitis due to animal (cat) (dog) hair and dander: Secondary | ICD-10-CM | POA: Diagnosis not present

## 2018-02-12 DIAGNOSIS — J301 Allergic rhinitis due to pollen: Secondary | ICD-10-CM | POA: Diagnosis not present

## 2018-02-19 ENCOUNTER — Other Ambulatory Visit (INDEPENDENT_AMBULATORY_CARE_PROVIDER_SITE_OTHER): Payer: Self-pay

## 2018-02-19 ENCOUNTER — Telehealth (INDEPENDENT_AMBULATORY_CARE_PROVIDER_SITE_OTHER): Payer: Self-pay

## 2018-02-19 NOTE — Telephone Encounter (Signed)
Dr. Magnus IvanBlackman asks if we can check on this patients MRI

## 2018-02-27 ENCOUNTER — Ambulatory Visit
Admission: RE | Admit: 2018-02-27 | Discharge: 2018-02-27 | Disposition: A | Discharge status: Home / Self Care | Payer: BLUE CROSS/BLUE SHIELD | Source: Ambulatory Visit | Attending: Orthopaedic Surgery | Admitting: Orthopaedic Surgery

## 2018-02-27 DIAGNOSIS — J3089 Other allergic rhinitis: Secondary | ICD-10-CM | POA: Diagnosis not present

## 2018-02-27 DIAGNOSIS — S46012A Strain of muscle(s) and tendon(s) of the rotator cuff of left shoulder, initial encounter: Secondary | ICD-10-CM | POA: Diagnosis not present

## 2018-02-27 DIAGNOSIS — M25512 Pain in left shoulder: Secondary | ICD-10-CM

## 2018-02-27 DIAGNOSIS — J3081 Allergic rhinitis due to animal (cat) (dog) hair and dander: Secondary | ICD-10-CM | POA: Diagnosis not present

## 2018-02-27 DIAGNOSIS — J301 Allergic rhinitis due to pollen: Secondary | ICD-10-CM | POA: Diagnosis not present

## 2018-03-05 ENCOUNTER — Ambulatory Visit (INDEPENDENT_AMBULATORY_CARE_PROVIDER_SITE_OTHER): Payer: BLUE CROSS/BLUE SHIELD | Admitting: Orthopaedic Surgery

## 2018-03-05 ENCOUNTER — Encounter (INDEPENDENT_AMBULATORY_CARE_PROVIDER_SITE_OTHER): Payer: Self-pay | Admitting: Orthopaedic Surgery

## 2018-03-05 DIAGNOSIS — S46012A Strain of muscle(s) and tendon(s) of the rotator cuff of left shoulder, initial encounter: Secondary | ICD-10-CM | POA: Diagnosis not present

## 2018-03-05 NOTE — Progress Notes (Signed)
The patient is very well-known to me.  He is very active 42 year old gentleman with no previous shoulder issues who injured his left shoulder while weightlifting back in April of this year.  He did feel a pop in his shoulder.  He has had weakness as well with external rotation and some abduction.  I did try conservative treatment with activity modification as well as anti-inflammatories and a steroid injection.  After continued pain and weakness we did significant MRI of his left shoulder and is here for review this today.  On exam he has excellent range of motion of his left shoulder but definitely some weakness of the rotator cuff itself.  The MRI is reviewed with him and it does show a full-thickness and retracted rotator cuff tear involving supraspinatus tendon.  There is associated bursitis with fluid in the subacromial subdeltoid area of the shoulder.  His acromion is type I and there is only mild AC joint arthritis.  His biceps tendon is intact.  I shared with him his MRI findings.  Given his young age and the extent of this full-thickness rotator cuff tear of the left shoulder we are recommending an arthroscopic rotator cuff repair.  I went over his MRI with him in detail.  I showed him a shoulder model we talked about surgery and this recommendation.  I talked about his intraoperative and postoperative course and the need for therapy following this.  Of note he is already had physical therapy in the shoulder as well.  He understands what the recovery will be in the time that it takes to get over shoulder issue such as this.  We did talk about the risk of surgery as well.  He would like to have this done after the Labor Day holiday.  I agree as well.  All questions were answered and addressed.  He does have my personal number to call if there is any issues prior to then or any other questions that he needs answered.  We would then see him back in 1 week postoperative for suture removal.

## 2018-03-06 DIAGNOSIS — J3081 Allergic rhinitis due to animal (cat) (dog) hair and dander: Secondary | ICD-10-CM | POA: Diagnosis not present

## 2018-03-06 DIAGNOSIS — J301 Allergic rhinitis due to pollen: Secondary | ICD-10-CM | POA: Diagnosis not present

## 2018-03-06 DIAGNOSIS — J3089 Other allergic rhinitis: Secondary | ICD-10-CM | POA: Diagnosis not present

## 2018-03-10 DIAGNOSIS — J301 Allergic rhinitis due to pollen: Secondary | ICD-10-CM | POA: Diagnosis not present

## 2018-03-10 DIAGNOSIS — J3089 Other allergic rhinitis: Secondary | ICD-10-CM | POA: Diagnosis not present

## 2018-03-10 DIAGNOSIS — J3081 Allergic rhinitis due to animal (cat) (dog) hair and dander: Secondary | ICD-10-CM | POA: Diagnosis not present

## 2018-03-18 ENCOUNTER — Telehealth (INDEPENDENT_AMBULATORY_CARE_PROVIDER_SITE_OTHER): Payer: Self-pay | Admitting: Orthopaedic Surgery

## 2018-03-19 NOTE — Telephone Encounter (Signed)
Error

## 2018-03-20 DIAGNOSIS — K598 Other specified functional intestinal disorders: Secondary | ICD-10-CM | POA: Diagnosis not present

## 2018-03-20 DIAGNOSIS — J301 Allergic rhinitis due to pollen: Secondary | ICD-10-CM | POA: Diagnosis not present

## 2018-03-20 DIAGNOSIS — J3089 Other allergic rhinitis: Secondary | ICD-10-CM | POA: Diagnosis not present

## 2018-03-20 DIAGNOSIS — R140 Abdominal distension (gaseous): Secondary | ICD-10-CM | POA: Diagnosis not present

## 2018-03-20 DIAGNOSIS — J3081 Allergic rhinitis due to animal (cat) (dog) hair and dander: Secondary | ICD-10-CM | POA: Diagnosis not present

## 2018-04-03 DIAGNOSIS — J3089 Other allergic rhinitis: Secondary | ICD-10-CM | POA: Diagnosis not present

## 2018-04-03 DIAGNOSIS — J3081 Allergic rhinitis due to animal (cat) (dog) hair and dander: Secondary | ICD-10-CM | POA: Diagnosis not present

## 2018-04-03 DIAGNOSIS — J301 Allergic rhinitis due to pollen: Secondary | ICD-10-CM | POA: Diagnosis not present

## 2018-04-21 DIAGNOSIS — J3081 Allergic rhinitis due to animal (cat) (dog) hair and dander: Secondary | ICD-10-CM | POA: Diagnosis not present

## 2018-04-21 DIAGNOSIS — J3089 Other allergic rhinitis: Secondary | ICD-10-CM | POA: Diagnosis not present

## 2018-04-21 DIAGNOSIS — E349 Endocrine disorder, unspecified: Secondary | ICD-10-CM | POA: Diagnosis not present

## 2018-04-21 DIAGNOSIS — F411 Generalized anxiety disorder: Secondary | ICD-10-CM | POA: Diagnosis not present

## 2018-04-21 DIAGNOSIS — J301 Allergic rhinitis due to pollen: Secondary | ICD-10-CM | POA: Diagnosis not present

## 2018-04-23 DIAGNOSIS — E349 Endocrine disorder, unspecified: Secondary | ICD-10-CM | POA: Diagnosis not present

## 2018-04-24 DIAGNOSIS — S46012A Strain of muscle(s) and tendon(s) of the rotator cuff of left shoulder, initial encounter: Secondary | ICD-10-CM | POA: Diagnosis not present

## 2018-04-24 DIAGNOSIS — G8918 Other acute postprocedural pain: Secondary | ICD-10-CM | POA: Diagnosis not present

## 2018-04-24 DIAGNOSIS — Y999 Unspecified external cause status: Secondary | ICD-10-CM | POA: Diagnosis not present

## 2018-04-24 DIAGNOSIS — X58XXXA Exposure to other specified factors, initial encounter: Secondary | ICD-10-CM | POA: Diagnosis not present

## 2018-04-24 DIAGNOSIS — M75122 Complete rotator cuff tear or rupture of left shoulder, not specified as traumatic: Secondary | ICD-10-CM | POA: Diagnosis not present

## 2018-05-01 ENCOUNTER — Ambulatory Visit (INDEPENDENT_AMBULATORY_CARE_PROVIDER_SITE_OTHER): Payer: BLUE CROSS/BLUE SHIELD | Admitting: Physician Assistant

## 2018-05-01 ENCOUNTER — Encounter (INDEPENDENT_AMBULATORY_CARE_PROVIDER_SITE_OTHER): Payer: Self-pay | Admitting: Physician Assistant

## 2018-05-01 DIAGNOSIS — Z9889 Other specified postprocedural states: Secondary | ICD-10-CM | POA: Insufficient documentation

## 2018-05-01 NOTE — Progress Notes (Signed)
[name] returns today status post left shoulder arthroscopy with arthroscopic rotator cuff repair.  He is overall doing well.  No fevers chills.  He still has some pain in the shoulder.  He is not wearing a sling today.  Physical exam: General well-developed well-nourished male in no acute distress.  Left shoulder port sites are well approximated with nylon sutures no signs of infection.  He has good range of motion elbow wrist and hand.  Impression: Status post left shoulder arthroscopy with arthroscopic rotator cuff repair  Plan: Sutures were removed today.  Dr. Magnus Rosario wrote a prescription for him to work with a personal trainer physical therapist he is to go slow in regards to his shoulder motion.  No active abduction or overhead activity.  See him back in 1 month sooner if there is any questions or concerns.

## 2018-05-12 DIAGNOSIS — M25512 Pain in left shoulder: Secondary | ICD-10-CM | POA: Diagnosis not present

## 2018-05-13 DIAGNOSIS — J3081 Allergic rhinitis due to animal (cat) (dog) hair and dander: Secondary | ICD-10-CM | POA: Diagnosis not present

## 2018-05-13 DIAGNOSIS — J3089 Other allergic rhinitis: Secondary | ICD-10-CM | POA: Diagnosis not present

## 2018-05-13 DIAGNOSIS — J301 Allergic rhinitis due to pollen: Secondary | ICD-10-CM | POA: Diagnosis not present

## 2018-05-16 DIAGNOSIS — M25512 Pain in left shoulder: Secondary | ICD-10-CM | POA: Diagnosis not present

## 2018-05-21 DIAGNOSIS — M25512 Pain in left shoulder: Secondary | ICD-10-CM | POA: Diagnosis not present

## 2018-05-23 DIAGNOSIS — Z91018 Allergy to other foods: Secondary | ICD-10-CM | POA: Diagnosis not present

## 2018-05-28 ENCOUNTER — Encounter (INDEPENDENT_AMBULATORY_CARE_PROVIDER_SITE_OTHER): Payer: Self-pay | Admitting: Physician Assistant

## 2018-05-28 ENCOUNTER — Ambulatory Visit (INDEPENDENT_AMBULATORY_CARE_PROVIDER_SITE_OTHER): Payer: BLUE CROSS/BLUE SHIELD | Admitting: Physician Assistant

## 2018-05-28 DIAGNOSIS — J3089 Other allergic rhinitis: Secondary | ICD-10-CM | POA: Diagnosis not present

## 2018-05-28 DIAGNOSIS — J3081 Allergic rhinitis due to animal (cat) (dog) hair and dander: Secondary | ICD-10-CM | POA: Diagnosis not present

## 2018-05-28 DIAGNOSIS — Z9889 Other specified postprocedural states: Secondary | ICD-10-CM

## 2018-05-28 DIAGNOSIS — J301 Allergic rhinitis due to pollen: Secondary | ICD-10-CM | POA: Diagnosis not present

## 2018-05-28 NOTE — Progress Notes (Signed)
HPI: [name] returns now 4 weeks status post left rotator cuff repair.  He states overall he is doing well.  He is been doing physical therapy no active exercises beyond 90 degrees.  Taking no pain medications.  He states that he is able to drive his back now without pain he was having pain when drying his back before due to the extreme internal rotation of the left shoulder with this activity.  Physical exam: Left shoulder passive range of motion Painful forward flexion.  Negative impingement testing.  Overall good cuff strength.  Impression: 4 weeks status post left rotator cuff repair  Plan: He will continue to work on range of motion and strength with physical therapy per protocol.  Follow-up with [place] in 4 weeks check his progress lack of.  Questions were encouraged and answered.

## 2018-05-30 DIAGNOSIS — M25512 Pain in left shoulder: Secondary | ICD-10-CM | POA: Diagnosis not present

## 2018-06-06 DIAGNOSIS — M25512 Pain in left shoulder: Secondary | ICD-10-CM | POA: Diagnosis not present

## 2018-06-13 DIAGNOSIS — Z4789 Encounter for other orthopedic aftercare: Secondary | ICD-10-CM | POA: Diagnosis not present

## 2018-06-19 DIAGNOSIS — J3089 Other allergic rhinitis: Secondary | ICD-10-CM | POA: Diagnosis not present

## 2018-06-19 DIAGNOSIS — J301 Allergic rhinitis due to pollen: Secondary | ICD-10-CM | POA: Diagnosis not present

## 2018-06-19 DIAGNOSIS — J3081 Allergic rhinitis due to animal (cat) (dog) hair and dander: Secondary | ICD-10-CM | POA: Diagnosis not present

## 2018-06-20 DIAGNOSIS — Z4789 Encounter for other orthopedic aftercare: Secondary | ICD-10-CM | POA: Diagnosis not present

## 2018-06-25 DIAGNOSIS — Z4789 Encounter for other orthopedic aftercare: Secondary | ICD-10-CM | POA: Diagnosis not present

## 2018-06-30 DIAGNOSIS — M25512 Pain in left shoulder: Secondary | ICD-10-CM | POA: Diagnosis not present

## 2018-07-01 DIAGNOSIS — Z4789 Encounter for other orthopedic aftercare: Secondary | ICD-10-CM | POA: Diagnosis not present

## 2018-07-03 DIAGNOSIS — J3081 Allergic rhinitis due to animal (cat) (dog) hair and dander: Secondary | ICD-10-CM | POA: Diagnosis not present

## 2018-07-03 DIAGNOSIS — J3089 Other allergic rhinitis: Secondary | ICD-10-CM | POA: Diagnosis not present

## 2018-07-03 DIAGNOSIS — J301 Allergic rhinitis due to pollen: Secondary | ICD-10-CM | POA: Diagnosis not present

## 2018-07-04 DIAGNOSIS — M25512 Pain in left shoulder: Secondary | ICD-10-CM | POA: Diagnosis not present

## 2018-07-09 DIAGNOSIS — Z4789 Encounter for other orthopedic aftercare: Secondary | ICD-10-CM | POA: Diagnosis not present

## 2018-07-11 DIAGNOSIS — Z4789 Encounter for other orthopedic aftercare: Secondary | ICD-10-CM | POA: Diagnosis not present

## 2018-07-16 DIAGNOSIS — Z4789 Encounter for other orthopedic aftercare: Secondary | ICD-10-CM | POA: Diagnosis not present

## 2018-07-17 DIAGNOSIS — J301 Allergic rhinitis due to pollen: Secondary | ICD-10-CM | POA: Diagnosis not present

## 2018-07-17 DIAGNOSIS — J3089 Other allergic rhinitis: Secondary | ICD-10-CM | POA: Diagnosis not present

## 2018-07-17 DIAGNOSIS — J3081 Allergic rhinitis due to animal (cat) (dog) hair and dander: Secondary | ICD-10-CM | POA: Diagnosis not present

## 2018-07-17 DIAGNOSIS — E349 Endocrine disorder, unspecified: Secondary | ICD-10-CM | POA: Diagnosis not present

## 2018-07-18 DIAGNOSIS — Z4789 Encounter for other orthopedic aftercare: Secondary | ICD-10-CM | POA: Diagnosis not present

## 2018-07-23 DIAGNOSIS — Z4789 Encounter for other orthopedic aftercare: Secondary | ICD-10-CM | POA: Diagnosis not present

## 2018-07-25 DIAGNOSIS — Z4789 Encounter for other orthopedic aftercare: Secondary | ICD-10-CM | POA: Diagnosis not present

## 2018-07-28 DIAGNOSIS — J0140 Acute pansinusitis, unspecified: Secondary | ICD-10-CM | POA: Diagnosis not present

## 2018-08-07 DIAGNOSIS — J3089 Other allergic rhinitis: Secondary | ICD-10-CM | POA: Diagnosis not present

## 2018-08-07 DIAGNOSIS — J3081 Allergic rhinitis due to animal (cat) (dog) hair and dander: Secondary | ICD-10-CM | POA: Diagnosis not present

## 2018-08-07 DIAGNOSIS — J301 Allergic rhinitis due to pollen: Secondary | ICD-10-CM | POA: Diagnosis not present

## 2018-08-11 DIAGNOSIS — Z4789 Encounter for other orthopedic aftercare: Secondary | ICD-10-CM | POA: Diagnosis not present

## 2018-08-12 DIAGNOSIS — E739 Lactose intolerance, unspecified: Secondary | ICD-10-CM | POA: Diagnosis not present

## 2018-08-12 DIAGNOSIS — K582 Mixed irritable bowel syndrome: Secondary | ICD-10-CM | POA: Diagnosis not present

## 2018-08-20 DIAGNOSIS — Z4789 Encounter for other orthopedic aftercare: Secondary | ICD-10-CM | POA: Diagnosis not present

## 2018-08-28 DIAGNOSIS — J3089 Other allergic rhinitis: Secondary | ICD-10-CM | POA: Diagnosis not present

## 2018-08-28 DIAGNOSIS — J301 Allergic rhinitis due to pollen: Secondary | ICD-10-CM | POA: Diagnosis not present

## 2018-08-28 DIAGNOSIS — J3081 Allergic rhinitis due to animal (cat) (dog) hair and dander: Secondary | ICD-10-CM | POA: Diagnosis not present

## 2018-08-29 DIAGNOSIS — Z4789 Encounter for other orthopedic aftercare: Secondary | ICD-10-CM | POA: Diagnosis not present

## 2018-09-03 DIAGNOSIS — Z4789 Encounter for other orthopedic aftercare: Secondary | ICD-10-CM | POA: Diagnosis not present

## 2018-09-04 DIAGNOSIS — J301 Allergic rhinitis due to pollen: Secondary | ICD-10-CM | POA: Diagnosis not present

## 2018-09-04 DIAGNOSIS — J3089 Other allergic rhinitis: Secondary | ICD-10-CM | POA: Diagnosis not present

## 2018-09-04 DIAGNOSIS — J3081 Allergic rhinitis due to animal (cat) (dog) hair and dander: Secondary | ICD-10-CM | POA: Diagnosis not present

## 2018-09-10 DIAGNOSIS — J301 Allergic rhinitis due to pollen: Secondary | ICD-10-CM | POA: Diagnosis not present

## 2018-09-10 DIAGNOSIS — J3089 Other allergic rhinitis: Secondary | ICD-10-CM | POA: Diagnosis not present

## 2018-09-10 DIAGNOSIS — Z4789 Encounter for other orthopedic aftercare: Secondary | ICD-10-CM | POA: Diagnosis not present

## 2018-09-10 DIAGNOSIS — J3081 Allergic rhinitis due to animal (cat) (dog) hair and dander: Secondary | ICD-10-CM | POA: Diagnosis not present

## 2018-09-18 DIAGNOSIS — R05 Cough: Secondary | ICD-10-CM | POA: Diagnosis not present

## 2018-09-18 DIAGNOSIS — J301 Allergic rhinitis due to pollen: Secondary | ICD-10-CM | POA: Diagnosis not present

## 2018-09-18 DIAGNOSIS — J3081 Allergic rhinitis due to animal (cat) (dog) hair and dander: Secondary | ICD-10-CM | POA: Diagnosis not present

## 2018-09-18 DIAGNOSIS — J3089 Other allergic rhinitis: Secondary | ICD-10-CM | POA: Diagnosis not present

## 2018-09-26 DIAGNOSIS — Z4789 Encounter for other orthopedic aftercare: Secondary | ICD-10-CM | POA: Diagnosis not present

## 2018-10-02 DIAGNOSIS — J301 Allergic rhinitis due to pollen: Secondary | ICD-10-CM | POA: Diagnosis not present

## 2018-10-02 DIAGNOSIS — J3081 Allergic rhinitis due to animal (cat) (dog) hair and dander: Secondary | ICD-10-CM | POA: Diagnosis not present

## 2018-10-02 DIAGNOSIS — J3089 Other allergic rhinitis: Secondary | ICD-10-CM | POA: Diagnosis not present

## 2018-10-03 DIAGNOSIS — Z4789 Encounter for other orthopedic aftercare: Secondary | ICD-10-CM | POA: Diagnosis not present

## 2018-10-08 DIAGNOSIS — Z4789 Encounter for other orthopedic aftercare: Secondary | ICD-10-CM | POA: Diagnosis not present

## 2018-10-09 DIAGNOSIS — H0014 Chalazion left upper eyelid: Secondary | ICD-10-CM | POA: Diagnosis not present

## 2018-10-09 DIAGNOSIS — J301 Allergic rhinitis due to pollen: Secondary | ICD-10-CM | POA: Diagnosis not present

## 2018-10-09 DIAGNOSIS — J3081 Allergic rhinitis due to animal (cat) (dog) hair and dander: Secondary | ICD-10-CM | POA: Diagnosis not present

## 2018-10-09 DIAGNOSIS — J3089 Other allergic rhinitis: Secondary | ICD-10-CM | POA: Diagnosis not present

## 2018-10-10 DIAGNOSIS — Z4789 Encounter for other orthopedic aftercare: Secondary | ICD-10-CM | POA: Diagnosis not present

## 2018-10-30 DIAGNOSIS — J301 Allergic rhinitis due to pollen: Secondary | ICD-10-CM | POA: Diagnosis not present

## 2018-10-30 DIAGNOSIS — J3089 Other allergic rhinitis: Secondary | ICD-10-CM | POA: Diagnosis not present

## 2018-10-30 DIAGNOSIS — Z4789 Encounter for other orthopedic aftercare: Secondary | ICD-10-CM | POA: Diagnosis not present

## 2018-10-30 DIAGNOSIS — J3081 Allergic rhinitis due to animal (cat) (dog) hair and dander: Secondary | ICD-10-CM | POA: Diagnosis not present

## 2018-11-03 DIAGNOSIS — L570 Actinic keratosis: Secondary | ICD-10-CM | POA: Diagnosis not present

## 2018-11-03 DIAGNOSIS — B009 Herpesviral infection, unspecified: Secondary | ICD-10-CM | POA: Diagnosis not present

## 2018-11-03 DIAGNOSIS — D235 Other benign neoplasm of skin of trunk: Secondary | ICD-10-CM | POA: Diagnosis not present

## 2018-11-20 DIAGNOSIS — J301 Allergic rhinitis due to pollen: Secondary | ICD-10-CM | POA: Diagnosis not present

## 2018-11-20 DIAGNOSIS — J3081 Allergic rhinitis due to animal (cat) (dog) hair and dander: Secondary | ICD-10-CM | POA: Diagnosis not present

## 2018-11-20 DIAGNOSIS — J3089 Other allergic rhinitis: Secondary | ICD-10-CM | POA: Diagnosis not present

## 2018-12-04 DIAGNOSIS — J3089 Other allergic rhinitis: Secondary | ICD-10-CM | POA: Diagnosis not present

## 2018-12-04 DIAGNOSIS — J301 Allergic rhinitis due to pollen: Secondary | ICD-10-CM | POA: Diagnosis not present

## 2018-12-04 DIAGNOSIS — J3081 Allergic rhinitis due to animal (cat) (dog) hair and dander: Secondary | ICD-10-CM | POA: Diagnosis not present

## 2018-12-18 DIAGNOSIS — J3081 Allergic rhinitis due to animal (cat) (dog) hair and dander: Secondary | ICD-10-CM | POA: Diagnosis not present

## 2018-12-18 DIAGNOSIS — J301 Allergic rhinitis due to pollen: Secondary | ICD-10-CM | POA: Diagnosis not present

## 2018-12-18 DIAGNOSIS — J3089 Other allergic rhinitis: Secondary | ICD-10-CM | POA: Diagnosis not present

## 2018-12-26 DIAGNOSIS — Z0000 Encounter for general adult medical examination without abnormal findings: Secondary | ICD-10-CM | POA: Diagnosis not present

## 2018-12-26 DIAGNOSIS — E349 Endocrine disorder, unspecified: Secondary | ICD-10-CM | POA: Diagnosis not present

## 2018-12-26 DIAGNOSIS — Z6831 Body mass index (BMI) 31.0-31.9, adult: Secondary | ICD-10-CM | POA: Diagnosis not present

## 2018-12-26 DIAGNOSIS — I10 Essential (primary) hypertension: Secondary | ICD-10-CM | POA: Diagnosis not present

## 2018-12-26 DIAGNOSIS — G5603 Carpal tunnel syndrome, bilateral upper limbs: Secondary | ICD-10-CM | POA: Diagnosis not present

## 2019-01-01 DIAGNOSIS — J3081 Allergic rhinitis due to animal (cat) (dog) hair and dander: Secondary | ICD-10-CM | POA: Diagnosis not present

## 2019-01-01 DIAGNOSIS — J301 Allergic rhinitis due to pollen: Secondary | ICD-10-CM | POA: Diagnosis not present

## 2019-01-01 DIAGNOSIS — J3089 Other allergic rhinitis: Secondary | ICD-10-CM | POA: Diagnosis not present

## 2019-01-15 DIAGNOSIS — J3081 Allergic rhinitis due to animal (cat) (dog) hair and dander: Secondary | ICD-10-CM | POA: Diagnosis not present

## 2019-01-15 DIAGNOSIS — J301 Allergic rhinitis due to pollen: Secondary | ICD-10-CM | POA: Diagnosis not present

## 2019-01-15 DIAGNOSIS — J3089 Other allergic rhinitis: Secondary | ICD-10-CM | POA: Diagnosis not present

## 2019-01-29 DIAGNOSIS — J3089 Other allergic rhinitis: Secondary | ICD-10-CM | POA: Diagnosis not present

## 2019-01-29 DIAGNOSIS — J3081 Allergic rhinitis due to animal (cat) (dog) hair and dander: Secondary | ICD-10-CM | POA: Diagnosis not present

## 2019-01-29 DIAGNOSIS — J301 Allergic rhinitis due to pollen: Secondary | ICD-10-CM | POA: Diagnosis not present

## 2019-02-04 DIAGNOSIS — Z91018 Allergy to other foods: Secondary | ICD-10-CM | POA: Diagnosis not present

## 2019-02-11 DIAGNOSIS — J3081 Allergic rhinitis due to animal (cat) (dog) hair and dander: Secondary | ICD-10-CM | POA: Diagnosis not present

## 2019-02-11 DIAGNOSIS — J301 Allergic rhinitis due to pollen: Secondary | ICD-10-CM | POA: Diagnosis not present

## 2019-02-11 DIAGNOSIS — J3089 Other allergic rhinitis: Secondary | ICD-10-CM | POA: Diagnosis not present

## 2019-02-12 DIAGNOSIS — E349 Endocrine disorder, unspecified: Secondary | ICD-10-CM | POA: Diagnosis not present

## 2019-02-19 DIAGNOSIS — J3089 Other allergic rhinitis: Secondary | ICD-10-CM | POA: Diagnosis not present

## 2019-02-19 DIAGNOSIS — J301 Allergic rhinitis due to pollen: Secondary | ICD-10-CM | POA: Diagnosis not present

## 2019-02-19 DIAGNOSIS — J3081 Allergic rhinitis due to animal (cat) (dog) hair and dander: Secondary | ICD-10-CM | POA: Diagnosis not present

## 2019-03-05 DIAGNOSIS — J301 Allergic rhinitis due to pollen: Secondary | ICD-10-CM | POA: Diagnosis not present

## 2019-03-05 DIAGNOSIS — J3081 Allergic rhinitis due to animal (cat) (dog) hair and dander: Secondary | ICD-10-CM | POA: Diagnosis not present

## 2019-03-05 DIAGNOSIS — J3089 Other allergic rhinitis: Secondary | ICD-10-CM | POA: Diagnosis not present

## 2019-03-12 DIAGNOSIS — J3089 Other allergic rhinitis: Secondary | ICD-10-CM | POA: Diagnosis not present

## 2019-03-12 DIAGNOSIS — J3081 Allergic rhinitis due to animal (cat) (dog) hair and dander: Secondary | ICD-10-CM | POA: Diagnosis not present

## 2019-03-12 DIAGNOSIS — J301 Allergic rhinitis due to pollen: Secondary | ICD-10-CM | POA: Diagnosis not present

## 2019-03-26 DIAGNOSIS — J3081 Allergic rhinitis due to animal (cat) (dog) hair and dander: Secondary | ICD-10-CM | POA: Diagnosis not present

## 2019-03-26 DIAGNOSIS — J3089 Other allergic rhinitis: Secondary | ICD-10-CM | POA: Diagnosis not present

## 2019-03-26 DIAGNOSIS — J301 Allergic rhinitis due to pollen: Secondary | ICD-10-CM | POA: Diagnosis not present

## 2019-04-09 DIAGNOSIS — K580 Irritable bowel syndrome with diarrhea: Secondary | ICD-10-CM | POA: Diagnosis not present

## 2019-04-09 DIAGNOSIS — E739 Lactose intolerance, unspecified: Secondary | ICD-10-CM | POA: Diagnosis not present

## 2019-04-16 DIAGNOSIS — J3081 Allergic rhinitis due to animal (cat) (dog) hair and dander: Secondary | ICD-10-CM | POA: Diagnosis not present

## 2019-04-16 DIAGNOSIS — J3089 Other allergic rhinitis: Secondary | ICD-10-CM | POA: Diagnosis not present

## 2019-04-16 DIAGNOSIS — J301 Allergic rhinitis due to pollen: Secondary | ICD-10-CM | POA: Diagnosis not present

## 2019-04-30 DIAGNOSIS — J3089 Other allergic rhinitis: Secondary | ICD-10-CM | POA: Diagnosis not present

## 2019-04-30 DIAGNOSIS — J3081 Allergic rhinitis due to animal (cat) (dog) hair and dander: Secondary | ICD-10-CM | POA: Diagnosis not present

## 2019-04-30 DIAGNOSIS — J301 Allergic rhinitis due to pollen: Secondary | ICD-10-CM | POA: Diagnosis not present

## 2019-05-14 DIAGNOSIS — J3089 Other allergic rhinitis: Secondary | ICD-10-CM | POA: Diagnosis not present

## 2019-05-14 DIAGNOSIS — J301 Allergic rhinitis due to pollen: Secondary | ICD-10-CM | POA: Diagnosis not present

## 2019-05-14 DIAGNOSIS — J3081 Allergic rhinitis due to animal (cat) (dog) hair and dander: Secondary | ICD-10-CM | POA: Diagnosis not present

## 2019-05-18 DIAGNOSIS — K589 Irritable bowel syndrome without diarrhea: Secondary | ICD-10-CM | POA: Diagnosis not present

## 2019-05-18 DIAGNOSIS — Z91018 Allergy to other foods: Secondary | ICD-10-CM | POA: Diagnosis not present

## 2019-05-18 DIAGNOSIS — R05 Cough: Secondary | ICD-10-CM | POA: Diagnosis not present

## 2019-05-26 DIAGNOSIS — Z91018 Allergy to other foods: Secondary | ICD-10-CM | POA: Diagnosis not present

## 2019-05-26 DIAGNOSIS — J3089 Other allergic rhinitis: Secondary | ICD-10-CM | POA: Diagnosis not present

## 2019-05-26 DIAGNOSIS — J301 Allergic rhinitis due to pollen: Secondary | ICD-10-CM | POA: Diagnosis not present

## 2019-05-26 DIAGNOSIS — K589 Irritable bowel syndrome without diarrhea: Secondary | ICD-10-CM | POA: Diagnosis not present

## 2019-05-26 DIAGNOSIS — R05 Cough: Secondary | ICD-10-CM | POA: Diagnosis not present

## 2019-05-26 DIAGNOSIS — J3081 Allergic rhinitis due to animal (cat) (dog) hair and dander: Secondary | ICD-10-CM | POA: Diagnosis not present

## 2019-05-28 DIAGNOSIS — J301 Allergic rhinitis due to pollen: Secondary | ICD-10-CM | POA: Diagnosis not present

## 2019-05-28 DIAGNOSIS — J3081 Allergic rhinitis due to animal (cat) (dog) hair and dander: Secondary | ICD-10-CM | POA: Diagnosis not present

## 2019-05-28 DIAGNOSIS — J3089 Other allergic rhinitis: Secondary | ICD-10-CM | POA: Diagnosis not present

## 2019-06-11 DIAGNOSIS — J3089 Other allergic rhinitis: Secondary | ICD-10-CM | POA: Diagnosis not present

## 2019-06-11 DIAGNOSIS — J301 Allergic rhinitis due to pollen: Secondary | ICD-10-CM | POA: Diagnosis not present

## 2019-06-11 DIAGNOSIS — J3081 Allergic rhinitis due to animal (cat) (dog) hair and dander: Secondary | ICD-10-CM | POA: Diagnosis not present

## 2019-06-25 DIAGNOSIS — J3081 Allergic rhinitis due to animal (cat) (dog) hair and dander: Secondary | ICD-10-CM | POA: Diagnosis not present

## 2019-06-25 DIAGNOSIS — J301 Allergic rhinitis due to pollen: Secondary | ICD-10-CM | POA: Diagnosis not present

## 2019-06-25 DIAGNOSIS — J3089 Other allergic rhinitis: Secondary | ICD-10-CM | POA: Diagnosis not present

## 2019-07-15 DIAGNOSIS — J301 Allergic rhinitis due to pollen: Secondary | ICD-10-CM | POA: Diagnosis not present

## 2019-07-15 DIAGNOSIS — J3081 Allergic rhinitis due to animal (cat) (dog) hair and dander: Secondary | ICD-10-CM | POA: Diagnosis not present

## 2019-07-15 DIAGNOSIS — J3089 Other allergic rhinitis: Secondary | ICD-10-CM | POA: Diagnosis not present

## 2019-07-16 DIAGNOSIS — J301 Allergic rhinitis due to pollen: Secondary | ICD-10-CM | POA: Diagnosis not present

## 2019-07-16 DIAGNOSIS — J3081 Allergic rhinitis due to animal (cat) (dog) hair and dander: Secondary | ICD-10-CM | POA: Diagnosis not present

## 2019-07-16 DIAGNOSIS — J3089 Other allergic rhinitis: Secondary | ICD-10-CM | POA: Diagnosis not present

## 2019-07-23 DIAGNOSIS — J3089 Other allergic rhinitis: Secondary | ICD-10-CM | POA: Diagnosis not present

## 2019-07-23 DIAGNOSIS — J3081 Allergic rhinitis due to animal (cat) (dog) hair and dander: Secondary | ICD-10-CM | POA: Diagnosis not present

## 2019-07-23 DIAGNOSIS — J301 Allergic rhinitis due to pollen: Secondary | ICD-10-CM | POA: Diagnosis not present

## 2019-08-04 DIAGNOSIS — J3081 Allergic rhinitis due to animal (cat) (dog) hair and dander: Secondary | ICD-10-CM | POA: Diagnosis not present

## 2019-08-04 DIAGNOSIS — J301 Allergic rhinitis due to pollen: Secondary | ICD-10-CM | POA: Diagnosis not present

## 2019-08-04 DIAGNOSIS — J3089 Other allergic rhinitis: Secondary | ICD-10-CM | POA: Diagnosis not present

## 2019-09-16 ENCOUNTER — Other Ambulatory Visit: Payer: Self-pay

## 2019-09-16 ENCOUNTER — Ambulatory Visit: Payer: BC Managed Care – PPO | Admitting: Orthopaedic Surgery

## 2019-09-16 ENCOUNTER — Ambulatory Visit: Payer: Self-pay

## 2019-09-16 DIAGNOSIS — M79642 Pain in left hand: Secondary | ICD-10-CM

## 2019-09-16 DIAGNOSIS — R200 Anesthesia of skin: Secondary | ICD-10-CM | POA: Diagnosis not present

## 2019-09-16 DIAGNOSIS — M25511 Pain in right shoulder: Secondary | ICD-10-CM | POA: Diagnosis not present

## 2019-09-16 MED ORDER — LIDOCAINE HCL 1 % IJ SOLN
3.0000 mL | INTRAMUSCULAR | Status: AC | PRN
  Administered 2019-09-16: 3 mL

## 2019-09-16 MED ORDER — METHYLPREDNISOLONE ACETATE 40 MG/ML IJ SUSP
40.0000 mg | INTRAMUSCULAR | Status: AC | PRN
  Administered 2019-09-16: 40 mg via INTRA_ARTICULAR

## 2019-09-16 NOTE — Progress Notes (Signed)
Office Visit Note   Patient: Austin Rosario           Date of Birth: 07/25/1976           MRN: [id number] Visit Date: 09/16/2019              Requested by: Austin Magnus, PA-C [address] [place],  [place] [address] PCP: Austin Magnus, PA-C   Assessment & Plan: Visit Diagnoses:  1. Right shoulder pain, unspecified chronicity   2. Numbness of left hand     Plan: Given his numbness and tingling of the left hand and given the fact that is waking up at night I would like to obtain nerve conduction studies of the left upper extremity to rule out carpal tunnel syndrome or other compressive neuropathy.  Also recommended trying a steroid injection in his right shoulder subacromial space.  He agrees with this plan and tolerated well.  I will see him back in 4 weeks from now.  By then he will have had his nerve studies hopefully and we can see how his shoulder is doing.  All question concerns were answered and addressed.  Follow-Up Instructions: Return in about 4 weeks (around 10/14/2019).   Orders:  Orders Placed This Encounter  Procedures  . Large Joint Inj  . XR Shoulder Right   No orders of the defined types were placed in this encounter.     Procedures: Large Joint Inj: R subacromial bursa on 09/16/2019 11:05 AM Indications: pain and diagnostic evaluation Details: 22 G 1.5 in needle  Arthrogram: No  Medications: 3 mL lidocaine 1 %; 40 mg methylPREDNISolone acetate 40 MG/ML Outcome: tolerated well, no immediate complications Procedure, treatment alternatives, risks and benefits explained, specific risks discussed. Consent was given by the patient. Immediately prior to procedure a time out was called to verify the correct patient, procedure, equipment, support staff and site/side marked as required. Patient was prepped and draped in the usual sterile fashion.       Clinical Data: No additional findings.   Subjective: Chief Complaint  Patient presents with  .  Right Shoulder - Pain  . Left Hand - Pain  Austin Rosario is well-known to me.  He has a history of a left shoulder rotator cuff repair.  That shoulder is doing well for him.  He comes in with a several month history of worsening right shoulder pain.  He feels like he may have injured that shoulder when he was young.  It hurts off and on with overhead activities.  He does report decreased strength and this is his dominant side.  He also reports a long history of left hand numbness.  He says it wakes him up at night.  Holding objects causes numbness and tingling in that left hand.  He said golf is really flaring things up recently for his left hand.  He is not a diabetic.  He is otherwise a very muscular and active and visual.  HPI  Review of Systems He currently denies any headache, chest pain, shortness of breath, fever, chills, nausea, vomiting  Objective: Vital Signs: There were no vitals taken for this visit.  Physical Exam He is alert and orient x3 and in no acute distress Ortho Exam Examination of his left shoulder is normal examination of his right shoulder shows positive Neer and Hawkins sign but full range of motion with no blocks to rotation.  His apprehension sign is negative.  His liftoff is negative.  He does feel  like he is got 5 out of 5 strength on that shoulder from my exam.  However his left shoulder had 5 out of 5 strength with a full-thickness rotator cuff tear.  Examination of his left hand shows no muscle atrophy.  He does have subjective numbness in the median and ulnar nerve distribution but not a true positive Tinel's sign. Specialty Comments:  No specialty comments available.  Imaging: XR Shoulder Right  Result Date: 09/16/2019 3 views of the right shoulder show no acute findings.  The shoulder is well located with no significant arthritic changes or narrowing at the [facility] joint or the glenohumeral joint.    PMFS History: Patient Active Problem List   Diagnosis Date Noted    . S/P left rotator cuff repair 05/01/2018  . Traumatic complete tear of left rotator cuff 03/05/2018  . Abdominal pain, epigastric 12/01/2013   Past Medical History:  Diagnosis Date  . GERD (gastroesophageal reflux disease)   . Ulcer     Family History  Problem Relation Age of Onset  . Breast cancer Maternal Grandmother   . Alcohol abuse Paternal Grandfather   . Colon cancer Paternal Grandfather     No past surgical history on file. Social History   Occupational History    Employer: Office manager  Tobacco Use  . Smoking status: Never Smoker  . Smokeless tobacco: Never Used  Substance and Sexual Activity  . Alcohol use: Yes  . Drug use: No  . Sexual activity: Not on file

## 2019-09-20 IMAGING — MR MR SHOULDER*L* W/O CM
5 series · 35 of 40 positions shown · non-contrast
Comparison: None.

CLINICAL DATA: Shoulder injury while lifting weights in November 2017.
Persistent pain and limited range of motion.

EXAM:
MRI OF THE LEFT SHOULDER WITHOUT CONTRAST
TECHNIQUE: Multiplanar, multisequence MR imaging of the shoulder was performed.
No intravenous contrast was administered.

[Series 3: PD fat-sat · axial · 4.0mm · 0.55mm/px · z∈[-51,+37]mm · 8 of 20 slices shown (1 of 2)]
[im 1/20]
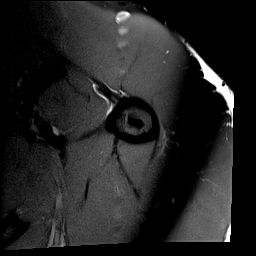
[im 3/20]
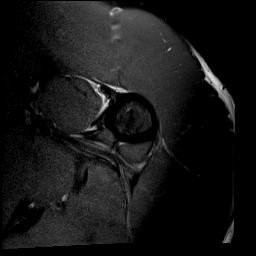
[im 6/20]
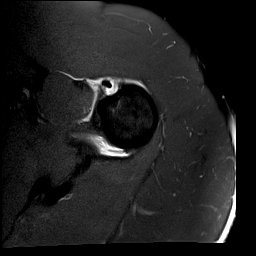
[im 9/20]
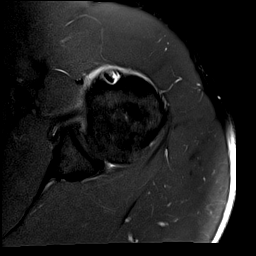
[im 11/20]
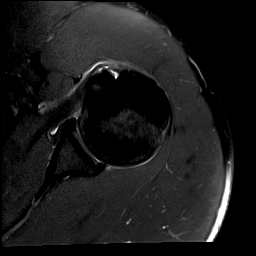
[im 14/20]
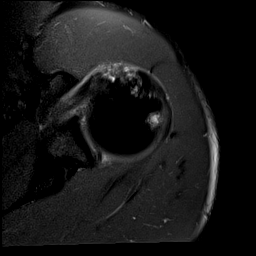
[im 17/20]
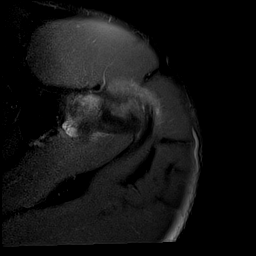
[im 20/20]
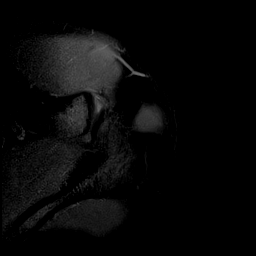

[Series 4: T2 fat-sat · oblique · 4.0mm · 0.55mm/px · 8 of 18 slices shown (1 of 2)]
[im 1/18]
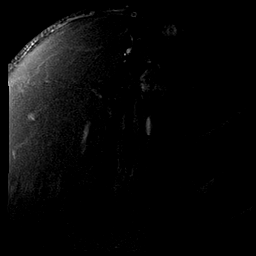
[im 3/18]
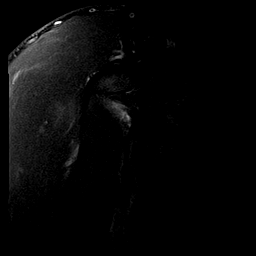
[im 5/18]
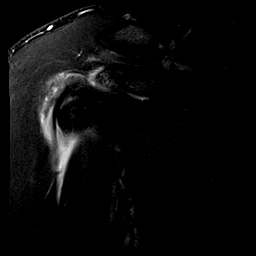
[im 8/18]
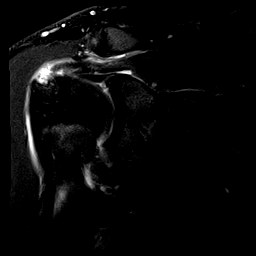
[im 10/18]
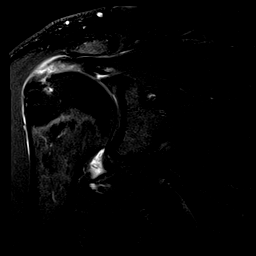
[im 13/18]
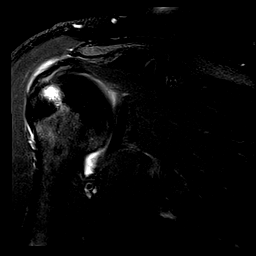
[im 15/18]
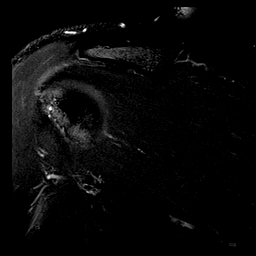
[im 18/18]
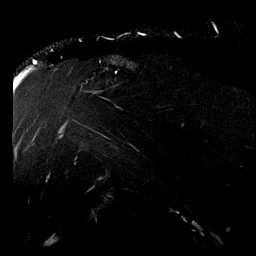

[Series 5: PD fat-sat · oblique · 4.0mm · 0.27mm/px · 8 of 18 slices shown (2 of 2)]
[im 1/18]
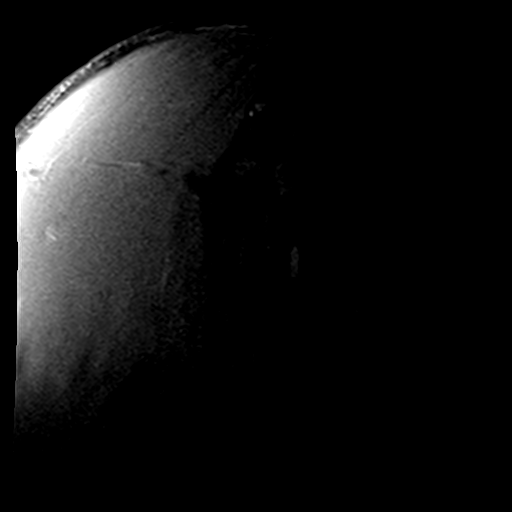
[im 3/18]
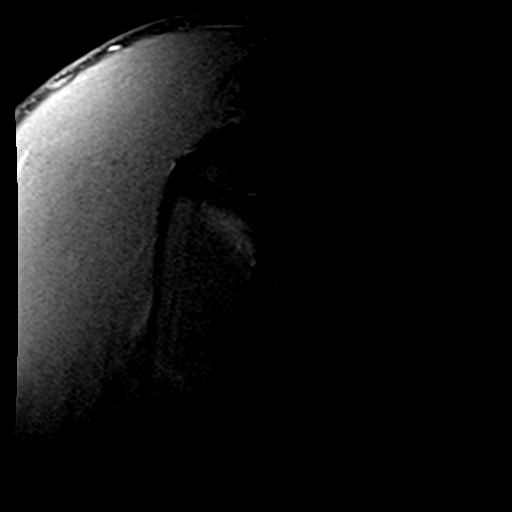
[im 5/18]
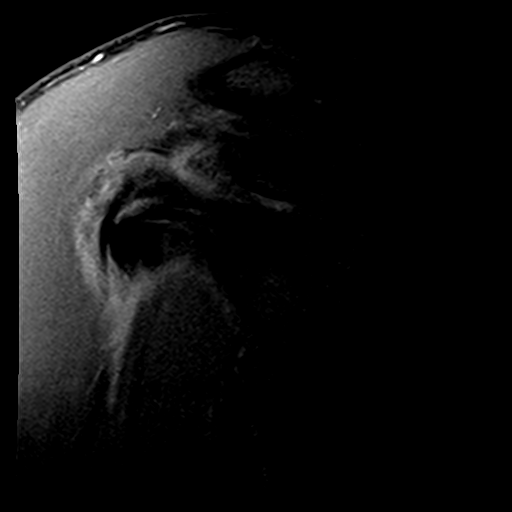
[im 8/18]
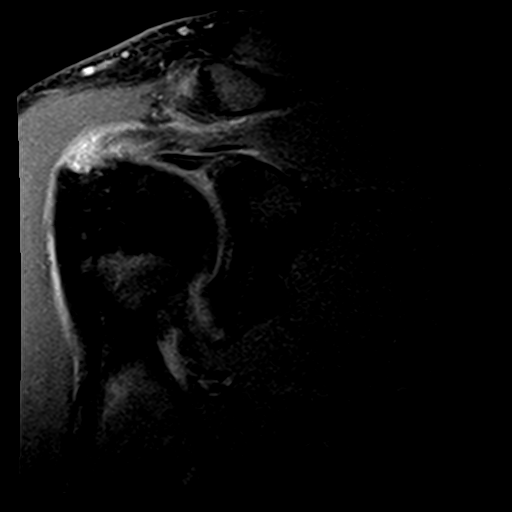
[im 10/18]
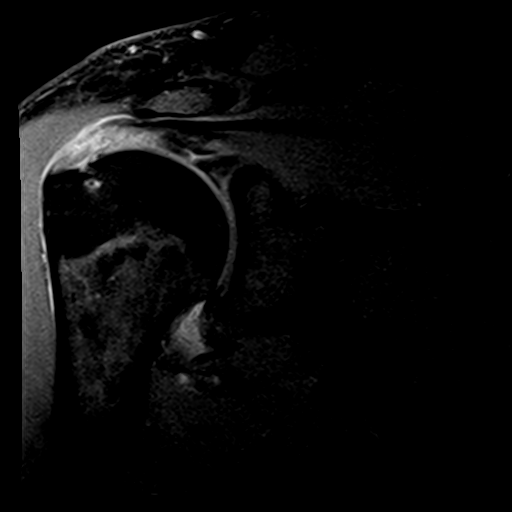
[im 13/18]
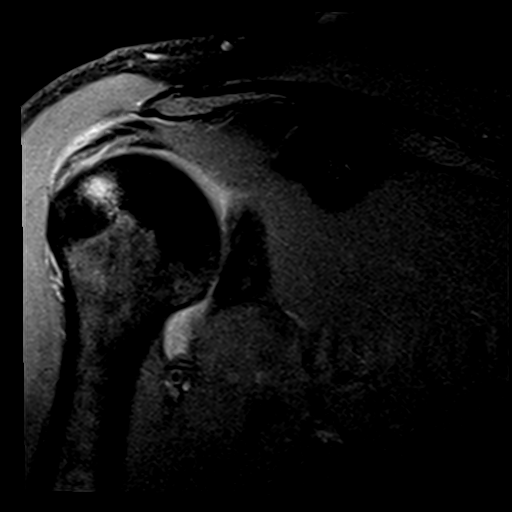
[im 15/18]
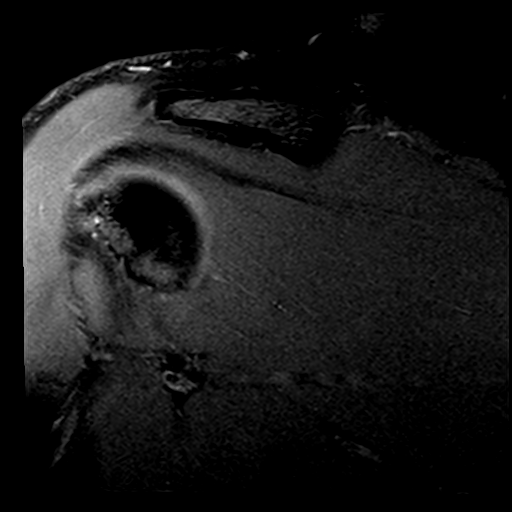
[im 18/18]
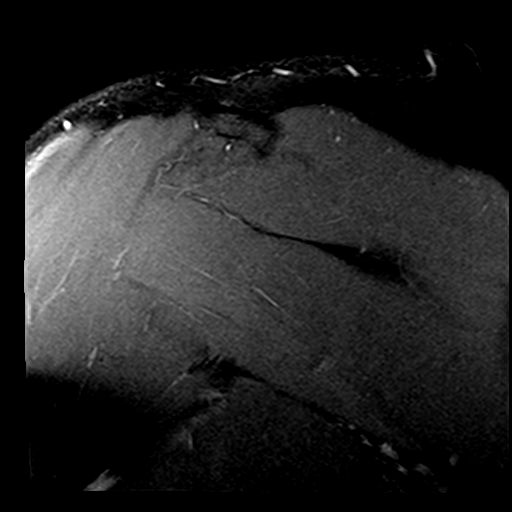

[Series 6: T1 · oblique · 4.0mm · 0.27mm/px · 3 of 20 slices shown]
[im 1/20]
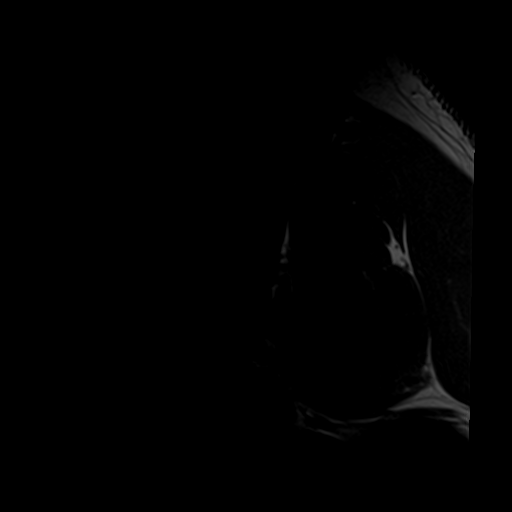
[im 3/20]
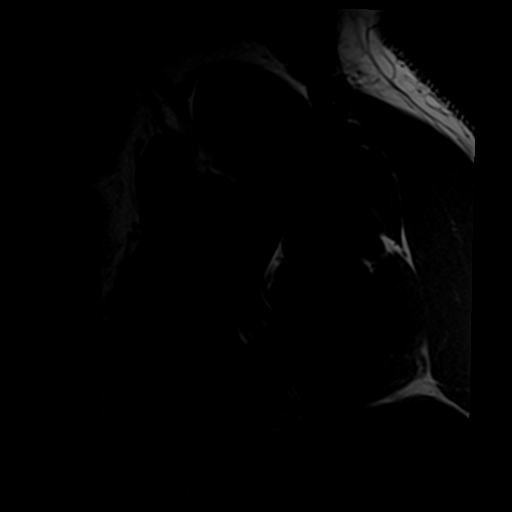
[im 6/20]
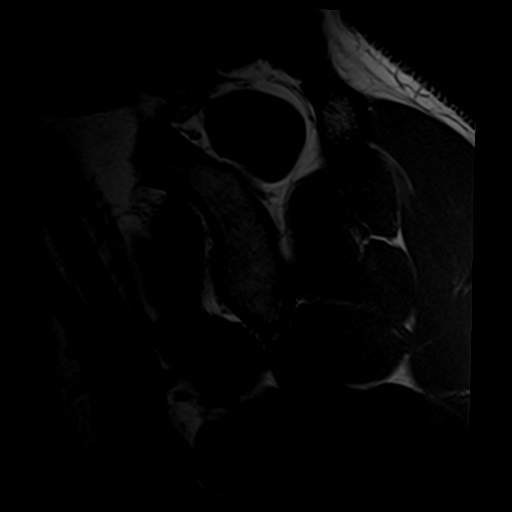

[Series 7: T2 fat-sat · oblique · 4.0mm · 0.55mm/px · 8 of 20 slices shown (2 of 2)]
[im 1/20]
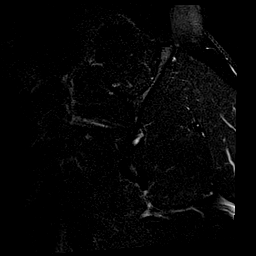
[im 3/20]
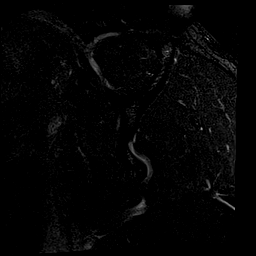
[im 6/20]
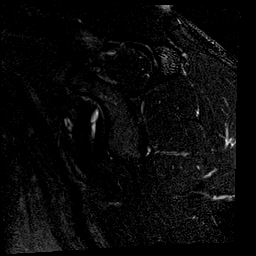
[im 9/20]
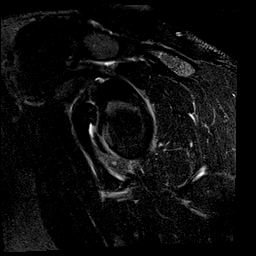
[im 11/20]
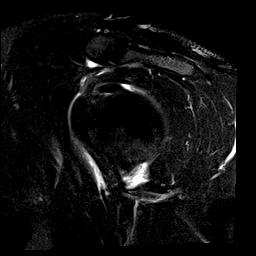
[im 14/20]
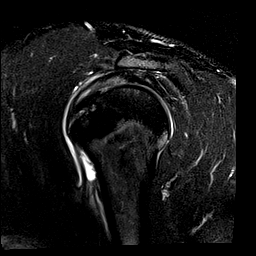
[im 17/20]
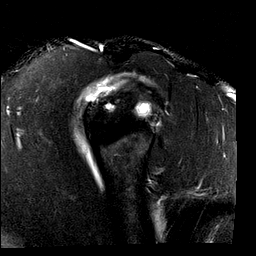
[im 20/20]
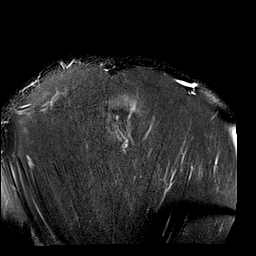

[35 of 40 positions shown; findings below may reference images not displayed]

FINDINGS: Rotator cuff: There is a full-thickness retracted tear involving the
anterior aspect of the supraspinatus tendon. Maximum retraction is
11 mm and the tear is approximately 15 mm wide. Significant
tendinopathy involving the rest of the supraspinatus tendon and
moderate tendinopathy involving the infraspinatus tendon. The
subscapularis tendon is intact. Mild tendinopathy.

Muscles:  Normal

Biceps long head:  Intact

Acromioclavicular Joint: Mild degenerative changes type 1 acromion.
No lateral downsloping or undersurface spurring.

Glenohumeral Joint: No degenerative changes. Small joint effusion
and mild synovitis.

Labrum: Small superior labral tears. The anterior and posterior
labrum appear intact.

Bones:  No acute bony findings.

Other: Expected fluid in the subacromial/subdeltoid bursa.
IMPRESSION: 1. Full-thickness retracted supraspinatus tendon tear anteriorly. 11
mm of retraction and the tear is 15 mm wide.
2. Tendinopathy involving the infraspinatus and subscapularis
tendons but no tear.
3. Suspect small superior labral tear.
4. Intact long head biceps tendon.
5. No significant findings for bony impingement.

## 2019-10-02 ENCOUNTER — Ambulatory Visit (INDEPENDENT_AMBULATORY_CARE_PROVIDER_SITE_OTHER): Payer: BC Managed Care – PPO | Admitting: Physical Medicine and Rehabilitation

## 2019-10-02 ENCOUNTER — Other Ambulatory Visit: Payer: Self-pay

## 2019-10-02 ENCOUNTER — Encounter: Payer: Self-pay | Admitting: Physical Medicine and Rehabilitation

## 2019-10-02 DIAGNOSIS — R202 Paresthesia of skin: Secondary | ICD-10-CM

## 2019-10-02 NOTE — Progress Notes (Signed)
   Numeric Pain Rating Scale and Functional Assessment Average Pain (2)   In the last MONTH (on 0-10 scale) has pain interfered with the following?  1. General activity like being  able to carry out your everyday physical activities such as walking, climbing stairs, carrying groceries, or moving a chair?  Rating(2)     

## 2019-10-05 NOTE — Procedures (Signed)
EMG & NCV Findings: Evaluation of the left median motor nerve showed prolonged distal onset latency (5.1 ms), reduced amplitude (3.4 mV), and decreased conduction velocity (Elbow-Wrist, 43 m/s).  The left median (across palm) sensory nerve showed prolonged distal peak latency (Wrist, 4.6 ms).  All remaining nerves (as indicated in the following tables) were within normal limits.    All examined muscles (as indicated in the following table) showed no evidence of electrical instability.    Impression: The above electrodiagnostic study is ABNORMAL and reveals evidence of a moderate left median nerve entrapment at the wrist (carpal tunnel syndrome) affecting sensory and motor components.   There is no significant electrodiagnostic evidence of any other focal nerve entrapment or brachial plexopathy.   Recommendations: 1.  Follow-up with referring physician. 2.  Continue current management of symptoms. 3.  Continue use of resting splint at night-time and as needed during the day. 4.  Suggest surgical evaluation.  ___________________________ Laurence Spates FAAPMR Board Certified, American Board of Physical Medicine and Rehabilitation    Nerve Conduction Studies Anti Sensory Summary Table   Stim Site NR Peak (ms) Norm Peak (ms) P-T Amp (V) Norm P-T Amp Site1 Site2 Delta-P (ms) Dist (cm) Vel (m/s) Norm Vel (m/s)  Left Median Acr Palm Anti Sensory (2nd Digit)  31.5C  Wrist    *4.6 <3.6 23.8 >10 Wrist Palm 2.6 0.0    Palm    2.0 <2.0 8.8         Left Radial Anti Sensory (Base 1st Digit)  31.6C  Wrist    2.2 <3.1 23.3  Wrist Base 1st Digit 2.2 0.0    Left Ulnar Anti Sensory (5th Digit)  31.9C  Wrist    3.1 <3.7 29.6 >15.0 Wrist 5th Digit 3.1 14.0 45 >38   Motor Summary Table   Stim Site NR Onset (ms) Norm Onset (ms) O-P Amp (mV) Norm O-P Amp Site1 Site2 Delta-0 (ms) Dist (cm) Vel (m/s) Norm Vel (m/s)  Left Median Motor (Abd Poll Brev)  31.7C    Martin-Gruber  Wrist    *5.1 <4.2 *3.4 >5  Elbow Wrist 4.8 20.5 *43 >50  Elbow    9.9  5.2         Left Ulnar Motor (Abd Dig Min)  31.7C  Wrist    3.3 <4.2 11.5 >3 B Elbow Wrist 3.4 20.0 59 >53  B Elbow    6.7  11.2  A Elbow B Elbow 1.2 10.0 83 >53  A Elbow    7.9  10.6          EMG   Side Muscle Nerve Root Ins Act Fibs Psw Amp Dur Poly Recrt Int Fraser Din Comment  Left Abd Poll Brev Median C8-T1 Nml Nml Nml Nml Nml 0 Nml Nml   Left 1stDorInt Ulnar C8-T1 Nml Nml Nml Nml Nml 0 Nml Nml   Left PronatorTeres Median C6-7 Nml Nml Nml Nml Nml 0 Nml Nml   Left Biceps Musculocut C5-6 Nml Nml Nml Nml Nml 0 Nml Nml   Left Deltoid Axillary C5-6 Nml Nml Nml Nml Nml 0 Nml Nml     Nerve Conduction Studies Anti Sensory Left/Right Comparison   Stim Site L Lat (ms) R Lat (ms) L-R Lat (ms) L Amp (V) R Amp (V) L-R Amp (%) Site1 Site2 L Vel (m/s) R Vel (m/s) L-R Vel (m/s)  Median Acr Palm Anti Sensory (2nd Digit)  31.5C  Wrist *4.6   23.8   Wrist [employer]  2.0   8.8         Radial Anti Sensory (Base 1st Digit)  31.6C  Wrist 2.2   23.3   Wrist Base 1st Digit     Ulnar Anti Sensory (5th Digit)  31.9C  Wrist 3.1   29.6   Wrist 5th Digit 45     Motor Left/Right Comparison   Stim Site L Lat (ms) R Lat (ms) L-R Lat (ms) L Amp (mV) R Amp (mV) L-R Amp (%) Site1 Site2 L Vel (m/s) R Vel (m/s) L-R Vel (m/s)  Median Motor (Abd Poll Brev)  31.7C    Martin-Gruber  Wrist *5.1   *3.4   Elbow Wrist *43    Elbow 9.9   5.2         Ulnar Motor (Abd Dig Min)  31.7C  Wrist 3.3   11.5   B Elbow Wrist 59    B Elbow 6.7   11.2   A Elbow B Elbow 83    A Elbow 7.9   10.6            Waveforms:

## 2019-10-05 NOTE — Progress Notes (Signed)
Austin Rosario - 44 y.o. male MRN [id number]  Date of birth: Apr 07, 1976  Office Visit Note: Visit Date: 10/02/2019 PCP: Lindley Magnus, PA-C Referred by: Lindley Magnus, PA-C  Subjective: Chief Complaint  Patient presents with  . Left Hand - Numbness   HPI: Austin Rosario is a 44 y.o. male who comes in today At the request of Dr. Doneen Poisson for electrodiagnostic study of the left upper limb.  Patient is somewhat ambidextrous uses his left hand quite a bit for eating and writing and does things with his right hand.  He reports chronic worsening numbness particularly in the middle and fourth digit.  He does report this wakes him up at night.  He reports initial symptoms seem to start after playing some golf.  He denies any specific radicular pain down the arms.  He has been having some right shoulder pain.  He has no history of diabetes.  No prior electrodiagnostic studies.  Rates his pain is only really a 2 out of 10 but it does bother him at night when it is numb.  Does bother him playing golf.  ROS Otherwise per HPI.  Assessment & Plan: Visit Diagnoses:  1. Paresthesia of skin     Plan: Impression: The above electrodiagnostic study is ABNORMAL and reveals evidence of a moderate left median nerve entrapment at the wrist (carpal tunnel syndrome) affecting sensory and motor components.   There is no significant electrodiagnostic evidence of any other focal nerve entrapment or brachial plexopathy.   Recommendations: 1.  Follow-up with referring physician. 2.  Continue current management of symptoms. 3.  Continue use of resting splint at night-time and as needed during the day. 4.  Suggest surgical evaluation.  Meds & Orders: No orders of the defined types were placed in this encounter.   Orders Placed This Encounter  Procedures  . NCV with EMG (electromyography)    Follow-up: Return for Doneen Poisson, M.D..   Procedures: No procedures performed  EMG &  NCV Findings: Evaluation of the left median motor nerve showed prolonged distal onset latency (5.1 ms), reduced amplitude (3.4 mV), and decreased conduction velocity (Elbow-Wrist, 43 m/s).  The left median (across palm) sensory nerve showed prolonged distal peak latency (Wrist, 4.6 ms).  All remaining nerves (as indicated in the following tables) were within normal limits.    All examined muscles (as indicated in the following table) showed no evidence of electrical instability.    Impression: The above electrodiagnostic study is ABNORMAL and reveals evidence of a moderate left median nerve entrapment at the wrist (carpal tunnel syndrome) affecting sensory and motor components.   There is no significant electrodiagnostic evidence of any other focal nerve entrapment or brachial plexopathy.   Recommendations: 1.  Follow-up with referring physician. 2.  Continue current management of symptoms. 3.  Continue use of resting splint at night-time and as needed during the day. 4.  Suggest surgical evaluation.  ___________________________ Naaman Plummer FAAPMR Board Certified, American Board of Physical Medicine and Rehabilitation    Nerve Conduction Studies Anti Sensory Summary Table   Stim Site NR Peak (ms) Norm Peak (ms) P-T Amp (V) Norm P-T Amp Site1 Site2 Delta-P (ms) Dist (cm) Vel (m/s) Norm Vel (m/s)  Left Median Acr Palm Anti Sensory (2nd Digit)  31.5C  Wrist    *4.6 <3.6 23.8 >10 Wrist Palm 2.6 0.0    Palm    2.0 <2.0 8.8         Left Radial Anti Sensory [facility]  1st Digit)  31.6C  Wrist    2.2 <3.1 23.3  Wrist Base 1st Digit 2.2 0.0    Left Ulnar Anti Sensory (5th Digit)  31.9C  Wrist    3.1 <3.7 29.6 >15.0 Wrist 5th Digit 3.1 14.0 45 >38   Motor Summary Table   Stim Site NR Onset (ms) Norm Onset (ms) O-P Amp (mV) Norm O-P Amp Site1 Site2 Delta-0 (ms) Dist (cm) Vel (m/s) Norm Vel (m/s)  Left Median Motor (Abd Poll Brev)  31.7C    Martin-Gruber  Wrist    *5.1 <4.2 *3.4 >5 Elbow  Wrist 4.8 20.5 *43 >50  Elbow    9.9  5.2         Left Ulnar Motor (Abd Dig Min)  31.7C  Wrist    3.3 <4.2 11.5 >3 B Elbow Wrist 3.4 20.0 59 >53  B Elbow    6.7  11.2  A Elbow B Elbow 1.2 10.0 83 >53  A Elbow    7.9  10.6          EMG   Side Muscle Nerve Root Ins Act Fibs Psw Amp Dur Poly Recrt Int Dennie Bible Comment  Left Abd Poll Brev Median C8-T1 Nml Nml Nml Nml Nml 0 Nml Nml   Left 1stDorInt Ulnar C8-T1 Nml Nml Nml Nml Nml 0 Nml Nml   Left PronatorTeres Median C6-7 Nml Nml Nml Nml Nml 0 Nml Nml   Left Biceps Musculocut C5-6 Nml Nml Nml Nml Nml 0 Nml Nml   Left Deltoid Axillary C5-6 Nml Nml Nml Nml Nml 0 Nml Nml     Nerve Conduction Studies Anti Sensory Left/Right Comparison   Stim Site L Lat (ms) R Lat (ms) L-R Lat (ms) L Amp (V) R Amp (V) L-R Amp (%) Site1 Site2 L Vel (m/s) R Vel (m/s) L-R Vel (m/s)  Median Acr Palm Anti Sensory (2nd Digit)  31.5C  Wrist *4.6   23.8   Wrist Palm     Palm 2.0   8.8         Radial Anti Sensory (Base 1st Digit)  31.6C  Wrist 2.2   23.3   Wrist Base 1st Digit     Ulnar Anti Sensory (5th Digit)  31.9C  Wrist 3.1   29.6   Wrist 5th Digit 45     Motor Left/Right Comparison   Stim Site L Lat (ms) R Lat (ms) L-R Lat (ms) L Amp (mV) R Amp (mV) L-R Amp (%) Site1 Site2 L Vel (m/s) R Vel (m/s) L-R Vel (m/s)  Median Motor (Abd Poll Brev)  31.7C    Martin-Gruber  Wrist *5.1   *3.4   Elbow Wrist *43    Elbow 9.9   5.2         Ulnar Motor (Abd Dig Min)  31.7C  Wrist 3.3   11.5   B Elbow Wrist 59    B Elbow 6.7   11.2   A Elbow B Elbow 83    A Elbow 7.9   10.6            Waveforms:             Clinical History: No specialty comments available.   He reports that he has never smoked. He has never used smokeless tobacco. No results for input(s): HGBA1C, LABURIC in the last 8760 hours.  Objective:  VS:  HT:    WT:   BMI:     BP:   HR: bpm  TEMP: ( )  RESP:  Physical Exam Musculoskeletal:        General: No tenderness.     Comments:  Inspection reveals no atrophy of the bilateral APB or FDI or hand intrinsics. There is no swelling, color changes, allodynia or dystrophic changes. There is 5 out of 5 strength in the bilateral wrist extension, finger abduction and long finger flexion. There is intact sensation to light touch in all dermatomal and peripheral nerve distributions.  There is a negative Hoffmann's test bilaterally.  Skin:    General: Skin is warm and dry.     Findings: No erythema or rash.  Neurological:     General: No focal deficit present.     Mental Status: He is alert and oriented to person, place, and time.     Sensory: No sensory deficit.     Motor: No weakness or abnormal muscle tone.     Coordination: Coordination normal.     Gait: Gait normal.  Psychiatric:        Mood and Affect: Mood normal.        Behavior: Behavior normal.        Thought Content: Thought content normal.     Ortho Exam Imaging: No results found.  Past Medical/Family/Surgical/Social History: Medications & Allergies reviewed per EMR, new medications updated. Patient Active Problem List   Diagnosis Date Noted  . S/P left rotator cuff repair 05/01/2018  . Traumatic complete tear of left rotator cuff 03/05/2018  . Abdominal pain, epigastric 12/01/2013   Past Medical History:  Diagnosis Date  . GERD (gastroesophageal reflux disease)   . Ulcer    Family History  Problem Relation Age of Onset  . Breast cancer Maternal Grandmother   . Alcohol abuse Paternal Grandfather   . Colon cancer Paternal Grandfather    History reviewed. No pertinent surgical history. Social History   Occupational History    Employer: Office manager  Tobacco Use  . Smoking status: Never Smoker  . Smokeless tobacco: Never Used  Substance and Sexual Activity  . Alcohol use: Yes  . Drug use: No  . Sexual activity: Not on file

## 2019-10-14 ENCOUNTER — Ambulatory Visit: Payer: BC Managed Care – PPO | Admitting: Orthopaedic Surgery

## 2019-10-14 ENCOUNTER — Encounter: Payer: Self-pay | Admitting: Orthopaedic Surgery

## 2019-10-14 ENCOUNTER — Other Ambulatory Visit: Payer: Self-pay

## 2019-10-14 DIAGNOSIS — G5602 Carpal tunnel syndrome, left upper limb: Secondary | ICD-10-CM | POA: Diagnosis not present

## 2019-10-14 DIAGNOSIS — R200 Anesthesia of skin: Secondary | ICD-10-CM | POA: Diagnosis not present

## 2019-10-14 DIAGNOSIS — M25511 Pain in right shoulder: Secondary | ICD-10-CM

## 2019-10-14 NOTE — Progress Notes (Signed)
Khameron comes in today to go over nerve conduction studies of his left upper extremity.  He has been have a lot of numbness thing in his left hand.  We only obtained studies on the left side.  He does report some slight numbness in his right dominant hand.  The nerve conduction studies do confirm moderate carpal tunnel syndrome with compression of the median nerve over the transverse carpal ligament.  I did explain to him what this means.  We talked about surgical intervention.  He understands that this is something that he should not wait on for many years and should do it sometime in the next year or so or even sooner so it does not become severe.  I described the transverse carpal ligament and the anatomy of the wrist.  I did talk about what surgery involves.  I discussed the risk and benefits of surgery.  We talked about what to expect in the intraoperative and postoperative course.  All question concerns were answered and addressed.  He does wish to proceed with surgery and I do feel this is reasonable and we will set this up in the near future.  We would then see him back in 2 weeks for suture removal.  Of note, he did have a good response to the steroid injection in his right shoulder.  It is still bothering him some and I may consider repeat injection while he is recovering from his carpal tunnel surgery.  He agrees with this treatment plan.

## 2019-10-22 ENCOUNTER — Other Ambulatory Visit: Payer: Self-pay | Admitting: Orthopaedic Surgery

## 2019-10-22 DIAGNOSIS — G5602 Carpal tunnel syndrome, left upper limb: Secondary | ICD-10-CM

## 2019-10-22 MED ORDER — HYDROCODONE-ACETAMINOPHEN 5-325 MG PO TABS: 1.0000 | ORAL_TABLET | Freq: Four times a day (QID) | ORAL | 0 refills | Status: DC | PRN

## 2019-10-27 ENCOUNTER — Telehealth: Payer: Self-pay | Admitting: Orthopaedic Surgery

## 2019-10-27 NOTE — Telephone Encounter (Signed)
Patient's wife called.   She wanted to make sure that her husband's post op appointment was properly scheduled. She feels two weeks afterwards is too long. She is requesting a call back on the mattter whether it was correct or incorrect.   Call back: [phone]

## 2019-10-27 NOTE — Telephone Encounter (Signed)
Patient wife aware this is correct

## 2019-11-05 ENCOUNTER — Ambulatory Visit (INDEPENDENT_AMBULATORY_CARE_PROVIDER_SITE_OTHER): Payer: BC Managed Care – PPO | Admitting: Orthopaedic Surgery

## 2019-11-05 ENCOUNTER — Other Ambulatory Visit: Payer: Self-pay

## 2019-11-05 ENCOUNTER — Encounter: Payer: Self-pay | Admitting: Orthopaedic Surgery

## 2019-11-05 DIAGNOSIS — M25511 Pain in right shoulder: Secondary | ICD-10-CM

## 2019-11-05 DIAGNOSIS — Z9889 Other specified postprocedural states: Secondary | ICD-10-CM

## 2019-11-05 NOTE — Progress Notes (Signed)
Austin Rosario is 2 weeks status post a left open carpal tunnel release.  This was secondary to quite moderate carpal tunnel syndrome confirmed on clinical exam and nerve conduction studies.  He is doing well from his surgery.  He denies any significant pain.  His incision looks good so the sutures been removed and Steri-Strips applied.  Overall he is progressing well after carpal tunnel surgery.  He is still dealing with right shoulder pain.  I have injected the shoulder before.  Given his history of a previous full-thickness tear of the rotator cuff on the left shoulder I feel that it is important at this point to obtain an MRI of his right shoulder to rule out rotator cuff tear based on his clinical exam findings combined with the failure of conservative treatment including activity modification, physical therapy, anti-inflammatories and steroid injections.  We will order an MRI of his right shoulder to rule out a rotator cuff tear.  We will see him back in 4 weeks regardless for his follow-up from his left carpal tunnel surgery.  All question concerns were answered and addressed.

## 2019-11-06 ENCOUNTER — Other Ambulatory Visit: Payer: Self-pay

## 2019-11-06 DIAGNOSIS — Z96611 Presence of right artificial shoulder joint: Secondary | ICD-10-CM

## 2019-12-03 ENCOUNTER — Ambulatory Visit: Payer: BC Managed Care – PPO | Admitting: Orthopaedic Surgery

## 2019-12-03 ENCOUNTER — Other Ambulatory Visit: Payer: Self-pay

## 2019-12-03 ENCOUNTER — Ambulatory Visit
Admission: RE | Admit: 2019-12-03 | Discharge: 2019-12-03 | Disposition: A | Discharge status: Home / Self Care | Payer: BC Managed Care – PPO | Source: Ambulatory Visit | Attending: Orthopaedic Surgery | Admitting: Orthopaedic Surgery

## 2019-12-03 DIAGNOSIS — Z96611 Presence of right artificial shoulder joint: Secondary | ICD-10-CM

## 2019-12-07 ENCOUNTER — Other Ambulatory Visit: Payer: Self-pay

## 2019-12-07 ENCOUNTER — Telehealth: Payer: Self-pay | Admitting: Orthopaedic Surgery

## 2019-12-07 ENCOUNTER — Ambulatory Visit: Payer: BC Managed Care – PPO | Admitting: Orthopaedic Surgery

## 2019-12-07 NOTE — Telephone Encounter (Signed)
Pt had an appt for 12/07/19 but due to [place] running behind he rescheduled for 12/10/19; the pt did state that it was just an MRI review and he would be ok with getting the results over the phone if that's something Dr. Magnus Ivan would be willing to do.   [phone]

## 2019-12-07 NOTE — Telephone Encounter (Signed)
Please advise 

## 2019-12-10 ENCOUNTER — Ambulatory Visit: Payer: BC Managed Care – PPO | Admitting: Orthopaedic Surgery

## 2020-04-27 ENCOUNTER — Ambulatory Visit: Payer: BC Managed Care – PPO | Admitting: Orthopaedic Surgery

## 2020-04-27 ENCOUNTER — Encounter: Payer: Self-pay | Admitting: Orthopaedic Surgery

## 2020-04-27 DIAGNOSIS — M79641 Pain in right hand: Secondary | ICD-10-CM

## 2020-04-27 DIAGNOSIS — G5601 Carpal tunnel syndrome, right upper limb: Secondary | ICD-10-CM

## 2020-04-27 MED ORDER — METHYLPREDNISOLONE ACETATE 40 MG/ML IJ SUSP
40.0000 mg | INTRAMUSCULAR | Status: AC | PRN
  Administered 2020-04-27: 40 mg

## 2020-04-27 MED ORDER — LIDOCAINE HCL 1 % IJ SOLN
1.0000 mL | INTRAMUSCULAR | Status: AC | PRN
  Administered 2020-04-27: 1 mL

## 2020-04-27 NOTE — Progress Notes (Signed)
Office Visit Note   Patient: Austin Rosario           Date of Birth: Aug 26, 1975           MRN: [id number] Visit Date: 04/27/2020              Requested by: Lindley Magnus, PA-C [address] [place],  [place] [address] PCP: Lindley Magnus, PA-C   Assessment & Plan: Visit Diagnoses:  1. Pain in right hand   2. Carpal tunnel syndrome, right upper limb     Plan: Per the patient's wishes I did provide a steroid injection around his right transverse carpal ligament.  I also recommended he try Voltaren gel over his hand and wrist on the right side as well as his left knee patella tendon.  All question concerns were answered and addressed.  Follow-up can be as needed.  Follow-Up Instructions: Return if symptoms worsen or fail to improve.   Orders:  Orders Placed This Encounter  Procedures  . Hand/UE Inj   No orders of the defined types were placed in this encounter.     Procedures: Hand/UE Inj: R carpal tunnel for carpal tunnel syndrome on 04/27/2020 9:09 AM Medications: 1 mL lidocaine 1 %; 40 mg methylPREDNISolone acetate 40 MG/ML      Clinical Data: No additional findings.   Subjective: Chief Complaint  Patient presents with  . Right Hand - Follow-up  Austin Rosario is well-known to me.  He is 6 months out from a left open carpal tunnel release.  His right hand has been bothering him in the palm of the hand he has been getting some numbness and tingling globally in that hand.  His left side had moderate carpal tunnel syndrome.  We did not perform a nerve conduction studies on the right side.  He is also been dealing with left knee patella tendinitis.  He is an avid golfer and has a golf cart about this weekend.  He is requesting a steroid injection in his right hand.  He has had no other acute changes in medical status.  He is now diabetic.  He has been working with a swing coach with golf and has had several times where he is hit the golf club into the ground causing some  vibration.  HPI  Review of Systems He currently denies any headache, chest pain, shortness of breath, fever, chills, nausea, vomiting  Objective: Vital Signs: There were no vitals taken for this visit.  Physical Exam He is alert and orient x3 and in no acute distress Ortho Exam Examination of his right hand shows a positive Phalen's and Tinel's sign and pain in the palm.  He also has some clicking in his wrist as well with flexion extension.  There is pain to palpation over his patella tendon on the left side.  He has normal pinch and grip strength on his right hand with no muscle atrophy. Specialty Comments:  No specialty comments available.  Imaging: No results found.   PMFS History: Patient Active Problem List   Diagnosis Date Noted  . Carpal tunnel syndrome, left upper limb 10/14/2019  . S/P left rotator cuff repair 05/01/2018  . Traumatic complete tear of left rotator cuff 03/05/2018  . Abdominal pain, epigastric 12/01/2013   Past Medical History:  Diagnosis Date  . GERD (gastroesophageal reflux disease)   . Ulcer     Family History  Problem Relation Age of Onset  . Breast cancer Maternal Grandmother   .  Alcohol abuse Paternal Grandfather   . Colon cancer Paternal Grandfather     History reviewed. No pertinent surgical history. Social History   Occupational History    Employer: Manufacturing engineer  Tobacco Use  . Smoking status: Never Smoker  . Smokeless tobacco: Never Used  Substance and Sexual Activity  . Alcohol use: Yes  . Drug use: No  . Sexual activity: Not on file

## 2020-10-31 ENCOUNTER — Encounter: Payer: Self-pay | Admitting: Physician Assistant

## 2020-10-31 ENCOUNTER — Ambulatory Visit: Payer: BC Managed Care – PPO | Admitting: Physician Assistant

## 2020-10-31 DIAGNOSIS — M79641 Pain in right hand: Secondary | ICD-10-CM | POA: Diagnosis not present

## 2020-10-31 MED ORDER — LIDOCAINE HCL 1 % IJ SOLN
1.0000 mL | INTRAMUSCULAR | Status: AC | PRN
  Administered 2020-10-31: 1 mL

## 2020-10-31 MED ORDER — METHYLPREDNISOLONE ACETATE 40 MG/ML IJ SUSP
40.0000 mg | INTRAMUSCULAR | Status: AC | PRN
Start: 2020-10-31 — End: 2020-10-31
  Administered 2020-10-31: 40 mg

## 2020-10-31 NOTE — Progress Notes (Signed)
   Procedure Note  Patient: Austin Rosario             Date of Birth: 03-26-1976           MRN: [id number]             Visit Date: 10/31/2020 HPI Neeraj returns today due to right hand.  He is requesting cortisone injection right hand.  He states the last injection for carpal tunnel syndrome on the right gave him good relief till recently.  This was performed on 04/27/2020.  Said no change in overall health.  He also wants to discuss possible surgery this coming winter for carpal tunnel release.  He had carpal tunnel release on the left and did well.  He continues to wear the braces on both hands.  He states his hand is goes numb periodically on the right.  He is nondiabetic.  Review of systems: See HPI otherwise negative or noncontributory.  Physical exam: General well-developed well-nourished male no acute distress mood and affect appropriate.  Right hand: Well-perfused.  Sensation grossly intact throughout.  Procedures: Visit Diagnoses:  1. Pain in right hand     Hand/UE Inj: R carpal tunnel for carpal tunnel syndrome on 10/31/2020 11:30 AM Medications: 1 mL lidocaine 1 %; 40 mg methylPREDNISolone acetate 40 MG/ML Consent was given by the patient. Immediately prior to procedure a time out was called to verify the correct patient, procedure, equipment, support staff and site/side marked as required. Patient was prepped and draped in the usual sterile fashion.     Plan: We will see him back on an as-needed basis.  Did discuss with him that he would need EMG nerve conduction studies on the right upper extremity as he has never had these on the right side.  Again he did have left carpal tunnel syndrome that was moderate by EMG nerve conduction studies.

## 2021-05-11 ENCOUNTER — Other Ambulatory Visit: Payer: Self-pay

## 2021-05-11 ENCOUNTER — Encounter: Payer: Self-pay | Admitting: Physician Assistant

## 2021-05-11 ENCOUNTER — Ambulatory Visit (INDEPENDENT_AMBULATORY_CARE_PROVIDER_SITE_OTHER): Payer: BC Managed Care – PPO | Admitting: Physician Assistant

## 2021-05-11 DIAGNOSIS — M25531 Pain in right wrist: Secondary | ICD-10-CM

## 2021-05-11 DIAGNOSIS — M79641 Pain in right hand: Secondary | ICD-10-CM

## 2021-05-11 MED ORDER — LIDOCAINE HCL 1 % IJ SOLN
1.0000 mL | INTRAMUSCULAR | Status: AC | PRN
  Administered 2021-05-11: 1 mL

## 2021-05-11 MED ORDER — METHYLPREDNISOLONE ACETATE 40 MG/ML IJ SUSP
40.0000 mg | INTRAMUSCULAR | Status: AC | PRN
  Administered 2021-05-11: 40 mg

## 2021-05-11 NOTE — Progress Notes (Signed)
   Procedure Note  Patient: Austin Rosario             Date of Birth: [birth year]           MRN: [id number]             Visit Date: 05/11/2021 HPI: Mr. Runnion comes in today for right hand numbness tingling.  We last saw him in March of this year and he underwent an injection for carpal tunnel syndrome.  He states that this helped.  He is wanting to discuss possible surgery.  He states the symptoms in his right hand are getting worse.  Symptoms are worse whenever he is holding objects for long period of time.  History of left carpal tunnel release left hand is overall doing well.  Physical exam: Right hand no rashes skin lesions ulcerations.  He has positive Tinel's over the median nerve at the wrist and also positive compression test over the median nerve at the wrist.  Sensation otherwise is intact throughout the right hand.  Procedures: Visit Diagnoses:  1. Pain in right hand     Hand/UE Inj for carpal tunnel syndrome on 05/11/2021 5:25 PM Details: volar approach Medications: 1 mL lidocaine 1 %; 40 mg methylPREDNISolone acetate 40 MG/ML Consent was given by the patient. Immediately prior to procedure a time out was called to verify the correct patient, procedure, equipment, support staff and site/side marked as required. Patient was prepped and draped in the usual sterile fashion.    Plan we will have him undergo EMG nerve conduction studies rule out carpal tunnel syndrome as source of the numbness tingling in his right hand.  Having follow-up after the studies to go over results and discuss further treatment.  Questions were encouraged and answered.

## 2021-05-12 NOTE — Addendum Note (Signed)
Addended by: Barbette Or on: 05/12/2021 08:29 AM   Modules accepted: Orders

## 2021-05-16 ENCOUNTER — Telehealth: Payer: Self-pay | Admitting: Physical Medicine and Rehabilitation

## 2021-05-16 NOTE — Telephone Encounter (Signed)
Patient called. He missed a call from someone to schedule with Dr. Alvester Morin. Would like a call back.

## 2021-05-22 ENCOUNTER — Telehealth: Payer: Self-pay | Admitting: Physical Medicine and Rehabilitation

## 2021-05-22 NOTE — Telephone Encounter (Signed)
Pt called and is trying to get a nerve conduction scheduled.   CB [phone]

## 2021-05-31 ENCOUNTER — Ambulatory Visit (INDEPENDENT_AMBULATORY_CARE_PROVIDER_SITE_OTHER): Payer: BC Managed Care – PPO | Admitting: Physical Medicine and Rehabilitation

## 2021-05-31 ENCOUNTER — Other Ambulatory Visit: Payer: Self-pay

## 2021-05-31 DIAGNOSIS — R202 Paresthesia of skin: Secondary | ICD-10-CM | POA: Diagnosis not present

## 2021-05-31 NOTE — Procedures (Signed)
EMG & NCV Findings: Evaluation of the right median motor nerve showed prolonged distal onset latency (4.9 ms), reduced amplitude (3.4 mV), and decreased conduction velocity (Elbow-Wrist, 41 m/s).  The right median (across palm) sensory nerve showed prolonged distal peak latency (Wrist, 4.3 ms).  All remaining nerves (as indicated in the following tables) were within normal limits.    All examined muscles (as indicated in the following table) showed no evidence of electrical instability.    Impression: The above electrodiagnostic study is ABNORMAL and reveals evidence of a moderate to severe right median nerve entrapment at the wrist (carpal tunnel syndrome) affecting sensory and motor components.   There is no significant electrodiagnostic evidence of any other focal nerve entrapment, brachial plexopathy or cervical radiculopathy  Recommendations: 1.  Follow-up with referring physician. 2.  Continue current management of symptoms. 3.  Suggest surgical evaluation.  ___________________________ Naaman Plummer FAAPMR Board Certified, American Board of Physical Medicine and Rehabilitation    Nerve Conduction Studies Anti Sensory Summary Table   Stim Site NR Peak (ms) Norm Peak (ms) P-T Amp (V) Norm P-T Amp Site1 Site2 Delta-P (ms) Dist (cm) Vel (m/s) Norm Vel (m/s)  Right Median Acr Palm Anti Sensory (2nd Digit)  31.9C  Wrist    *4.3 <3.6 13.0 >10 Wrist Palm 2.6 0.0    Palm    1.7 <2.0 27.2         Right Radial Anti Sensory (Base 1st Digit)  31.8C  Wrist    1.8 <3.1 4.3  Wrist Base 1st Digit 1.8 0.0    Right Ulnar Anti Sensory (5th Digit)  32.3C  Wrist    3.1 <3.7 21.5 >15.0 Wrist 5th Digit 3.1 14.0 45 >38   Motor Summary Table   Stim Site NR Onset (ms) Norm Onset (ms) O-P Amp (mV) Norm O-P Amp Site1 Site2 Delta-0 (ms) Dist (cm) Vel (m/s) Norm Vel (m/s)  Right Median Motor (Abd Poll Brev)  32C    Martin-Gruber  Wrist    *4.9 <4.2 *3.4 >5 Elbow Wrist 4.9 20.0 *41 >50  Elbow    9.8   4.6         Right Ulnar Motor (Abd Dig Min)  31.8C  Wrist    3.1 <4.2 11.2 >3 B Elbow Wrist 3.2 21.5 67 >53  B Elbow    6.3  9.4  A Elbow B Elbow 1.4 10.0 71 >53  A Elbow    7.7  9.5          EMG   Side Muscle Nerve Root Ins Act Fibs Psw Amp Dur Poly Recrt Int Dennie Bible Comment  Right Abd Poll Brev Median C8-T1 Nml Nml Nml Nml Nml 0 Nml Nml   Right 1stDorInt Ulnar C8-T1 Nml Nml Nml Nml Nml 0 Nml Nml   Right PronatorTeres Median C6-7 Nml Nml Nml Nml Nml 0 Nml Nml     Nerve Conduction Studies Anti Sensory Left/Right Comparison   Stim Site L Lat (ms) R Lat (ms) L-R Lat (ms) L Amp (V) R Amp (V) L-R Amp (%) Site1 Site2 L Vel (m/s) R Vel (m/s) L-R Vel (m/s)  Median Acr Palm Anti Sensory (2nd Digit)  31.9C  Wrist  *4.3   13.0  Wrist Palm     Palm  1.7   27.2        Radial Anti Sensory (Base 1st Digit)  31.8C  Wrist  1.8   4.3  Wrist Base 1st Digit     Ulnar Anti Sensory (5th  Digit)  32.3C  Wrist  3.1   21.5  Wrist 5th Digit  45    Motor Left/Right Comparison   Stim Site L Lat (ms) R Lat (ms) L-R Lat (ms) L Amp (mV) R Amp (mV) L-R Amp (%) Site1 Site2 L Vel (m/s) R Vel (m/s) L-R Vel (m/s)  Median Motor (Abd Poll Brev)  32C    Martin-Gruber  Wrist  *4.9   *3.4  Elbow Wrist  *41   Elbow  9.8   4.6        Ulnar Motor (Abd Dig Min)  31.8C  Wrist  3.1   11.2  B Elbow Wrist  67   B Elbow  6.3   9.4  A Elbow B Elbow  71   A Elbow  7.7   9.5           Waveforms:

## 2021-05-31 NOTE — Progress Notes (Signed)
Austin Rosario - 45 y.o. male MRN [id number]  Date of birth: 1976-02-16  Office Visit Note: Visit Date: 05/31/2021 PCP: Rick Duff, PA-C Referred by: Rick Duff, PA-C  Subjective: Chief Complaint  Patient presents with   Right Hand - Numbness   HPI:  Austin Rosario is a 45 y.o. male who comes in today at the request of Rexene Edison, PA-C for electrodiagnostic study of the Right upper extremities.  Patient is Right hand dominant.  Reporting chronic worsening severe right hand numbness in a global fashion.  He does endorse nocturnal complaints and worsening with certain positions.  Denies any frank radicular symptoms.  He has had prior electrodiagnostic study on the left hand showing moderate median neuropathy of the wrist.  He subsequently had decompression of the left carpal tunnel by Dr. Doneen Poisson with good relief of symptoms.  He feels like the symptoms are very similar.   ROS Otherwise per HPI.  Assessment & Plan: Visit Diagnoses:    ICD-10-CM   1. Paresthesia of skin  R20.2 NCV with EMG (electromyography)      Plan:  Impression: The above electrodiagnostic study is ABNORMAL and reveals evidence of a moderate to severe right median nerve entrapment at the wrist (carpal tunnel syndrome) affecting sensory and motor components.   There is no significant electrodiagnostic evidence of any other focal nerve entrapment, brachial plexopathy or cervical radiculopathy  Recommendations: 1.  Follow-up with referring physician. 2.  Continue current management of symptoms. 3.  Suggest surgical evaluation.  Meds & Orders: No orders of the defined types were placed in this encounter.   Orders Placed This Encounter  Procedures   NCV with EMG (electromyography)    Follow-up: Return in about 2 weeks (around 06/14/2021) for Rexene Edison, PA-C.   Procedures: No procedures performed  EMG & NCV Findings: Evaluation of the right median motor nerve showed  prolonged distal onset latency (4.9 ms), reduced amplitude (3.4 mV), and decreased conduction velocity (Elbow-Wrist, 41 m/s).  The right median (across palm) sensory nerve showed prolonged distal peak latency (Wrist, 4.3 ms).  All remaining nerves (as indicated in the following tables) were within normal limits.    All examined muscles (as indicated in the following table) showed no evidence of electrical instability.    Impression: The above electrodiagnostic study is ABNORMAL and reveals evidence of a moderate to severe right median nerve entrapment at the wrist (carpal tunnel syndrome) affecting sensory and motor components.   There is no significant electrodiagnostic evidence of any other focal nerve entrapment, brachial plexopathy or cervical radiculopathy  Recommendations: 1.  Follow-up with referring physician. 2.  Continue current management of symptoms. 3.  Suggest surgical evaluation.  ___________________________ Naaman Plummer FAAPMR Board Certified, American Board of Physical Medicine and Rehabilitation    Nerve Conduction Studies Anti Sensory Summary Table   Stim Site NR Peak (ms) Norm Peak (ms) P-T Amp (V) Norm P-T Amp Site1 Site2 Delta-P (ms) Dist (cm) Vel (m/s) Norm Vel (m/s)  Right Median Acr Palm Anti Sensory (2nd Digit)  31.9C  Wrist    *4.3 <3.6 13.0 >10 Wrist Palm 2.6 0.0    Palm    1.7 <2.0 27.2         Right Radial Anti Sensory (Base 1st Digit)  31.8C  Wrist    1.8 <3.1 4.3  Wrist Base 1st Digit 1.8 0.0    Right Ulnar Anti Sensory (5th Digit)  32.3C  Wrist    3.1 <3.7 21.5 >  15.0 Wrist 5th Digit 3.1 14.0 45 >38   Motor Summary Table   Stim Site NR Onset (ms) Norm Onset (ms) O-P Amp (mV) Norm O-P Amp Site1 Site2 Delta-0 (ms) Dist (cm) Vel (m/s) Norm Vel (m/s)  Right Median Motor (Abd Poll Brev)  32C    Martin-Gruber  Wrist    *4.9 <4.2 *3.4 >5 Elbow Wrist 4.9 20.0 *41 >50  Elbow    9.8  4.6         Right Ulnar Motor (Abd Dig Min)  31.8C  Wrist    3.1  <4.2 11.2 >3 B Elbow Wrist 3.2 21.5 67 >53  B Elbow    6.3  9.4  A Elbow B Elbow 1.4 10.0 71 >53  A Elbow    7.7  9.5          EMG   Side Muscle Nerve Root Ins Act Fibs Psw Amp Dur Poly Recrt Int Dennie Bible Comment  Right Abd Poll Brev Median C8-T1 Nml Nml Nml Nml Nml 0 Nml Nml   Right 1stDorInt Ulnar C8-T1 Nml Nml Nml Nml Nml 0 Nml Nml   Right PronatorTeres Median C6-7 Nml Nml Nml Nml Nml 0 Nml Nml     Nerve Conduction Studies Anti Sensory Left/Right Comparison   Stim Site L Lat (ms) R Lat (ms) L-R Lat (ms) L Amp (V) R Amp (V) L-R Amp (%) Site1 Site2 L Vel (m/s) R Vel (m/s) L-R Vel (m/s)  Median Acr Palm Anti Sensory (2nd Digit)  31.9C  Wrist  *4.3   13.0  Wrist Palm     Palm  1.7   27.2        Radial Anti Sensory (Base 1st Digit)  31.8C  Wrist  1.8   4.3  Wrist Base 1st Digit     Ulnar Anti Sensory (5th Digit)  32.3C  Wrist  3.1   21.5  Wrist 5th Digit  45    Motor Left/Right Comparison   Stim Site L Lat (ms) R Lat (ms) L-R Lat (ms) L Amp (mV) R Amp (mV) L-R Amp (%) Site1 Site2 L Vel (m/s) R Vel (m/s) L-R Vel (m/s)  Median Motor (Abd Poll Brev)  32C    Martin-Gruber  Wrist  *4.9   *3.4  Elbow Wrist  *41   Elbow  9.8   4.6        Ulnar Motor (Abd Dig Min)  31.8C  Wrist  3.1   11.2  B Elbow Wrist  67   B Elbow  6.3   9.4  A Elbow B Elbow  71   A Elbow  7.7   9.5           Waveforms:            Clinical History: 10/02/19 Electrodiagnostic study  Impression: The above electrodiagnostic study is ABNORMAL and reveals evidence of a moderate left median nerve entrapment at the wrist (carpal tunnel syndrome) affecting sensory and motor components.    There is no significant electrodiagnostic evidence of any other focal nerve entrapment or brachial plexopathy.    Recommendations: 1.  Follow-up with referring physician. 2.  Continue current management of symptoms. 3.  Continue use of resting splint at night-time and as needed during the day. 4.  Suggest surgical  evaluation.   ___________________________ Elease Hashimoto Board Certified, American Board of Physical Medicine and Rehabilitation     Objective:  VS:  HT:    WT:   BMI:  BP:   HR: bpm  TEMP: ( )  RESP:  Physical Exam Musculoskeletal:        General: No tenderness.     Comments: Inspection reveals no atrophy of the bilateral APB or FDI or hand intrinsics. There is no swelling, color changes, allodynia or dystrophic changes. There is 5 out of 5 strength in the bilateral wrist extension, finger abduction and long finger flexion. There is intact sensation to light touch in all dermatomal and peripheral nerve distributions. Hannah Beat is a negative Hoffmann's test bilaterally.  Skin:    General: Skin is warm and dry.     Findings: No erythema or rash.  Neurological:     General: No focal deficit present.     Mental Status: He is alert and oriented to person, place, and time.     Sensory: No sensory deficit.     Motor: No weakness or abnormal muscle tone.     Coordination: Coordination normal.     Gait: Gait normal.  Psychiatric:        Mood and Affect: Mood normal.        Behavior: Behavior normal.        Thought Content: Thought content normal.     Imaging: No results found.

## 2021-05-31 NOTE — Progress Notes (Signed)
Numbness in all fingers of right hand.  Uses right and left hands equally No lotion per patient

## 2021-06-14 ENCOUNTER — Other Ambulatory Visit: Payer: Self-pay

## 2021-06-14 ENCOUNTER — Ambulatory Visit (INDEPENDENT_AMBULATORY_CARE_PROVIDER_SITE_OTHER): Payer: BC Managed Care – PPO | Admitting: Orthopaedic Surgery

## 2021-06-14 DIAGNOSIS — G5601 Carpal tunnel syndrome, right upper limb: Secondary | ICD-10-CM

## 2021-06-14 NOTE — Progress Notes (Signed)
Austin Rosario is well-known to me.  We have actually performed rotator cuff repair of the shoulder before and a left open carpal tunnel release.  He has had recent nerve conduction studies of his right upper extremity.  I have reviewed those and they do show moderate to severe right carpal tunnel syndrome.  He does report numbness in the median nerve distribution.  He has numbness with different positioning of his hand with driving and waking up at night as well.  He has done well with his left open carpal tunnel release.  On exam there is no muscle atrophy of his right hand.  There is positive Phalen's and Tinel's sign.  He has numbness in the median nerve distribution.  Fortunately there is no weakness with grip and pinch strength.  I did go over the nerve conduction study findings with him.  We recommended a right open carpal tunnel release.  Having had this before he is fully aware of the risk and benefits of surgery.  He would like to have this done in early December.  We will work on getting this scheduled for a right open carpal tunnel release and be in touch and see him back in 2 weeks postoperative.  All question concerns were answered addressed.  There is been no acute change in medical status.  He is only 45 years old.

## 2021-06-30 ENCOUNTER — Telehealth: Payer: Self-pay | Admitting: Orthopaedic Surgery

## 2021-07-10 ENCOUNTER — Ambulatory Visit (INDEPENDENT_AMBULATORY_CARE_PROVIDER_SITE_OTHER): Payer: BC Managed Care – PPO | Admitting: Physician Assistant

## 2021-07-10 ENCOUNTER — Encounter: Payer: Self-pay | Admitting: Physician Assistant

## 2021-07-10 DIAGNOSIS — G5601 Carpal tunnel syndrome, right upper limb: Secondary | ICD-10-CM

## 2021-07-10 DIAGNOSIS — R202 Paresthesia of skin: Secondary | ICD-10-CM | POA: Diagnosis not present

## 2021-07-10 NOTE — Progress Notes (Signed)
HPI: Austin Rosario comes in today due to vague sensation in his left hand.  He has history of left carpal surgery in March 2021.  He is scheduled for right carpal tunnel surgery this coming Thursday.  States this is getting a twinge of nervelike pain in his left hand.  He starts in the lateral aspect of his palm and goes into the fifth finger.  He describes it as just minimal discomfort.  He has had no new injury.  Review of systems see HPI otherwise negative  Physical exam: Bilateral hands left hand well-healed surgical incision from carpal tunnel release.  Has heavy callus over the palmar surface ulnar side.  Positive Tinel's left elbow over the ulnar nerve. Right hand positive Tinel's over the median nerve at the wrist.  No triggering fingers callus formation over the fourth finger volar aspect at the metacarpal phalangeal joint region.  Impression: Right carpal tunnel syndrome Left hand paresthesia ulnar distribution  Plan: He is to undergo right carpal tunnel release this Thursday.  In regards to the left hand paresthesia ulnar distribution recommend Voltaren gel at the elbow.  Also would like for him to be more conscientious of how he is lifting to see if he can figure out what is causing the callus formation volar aspect ulnar distribution of the palm.  We will see him back 2 weeks postop.  Questions were encouraged and answered

## 2021-07-13 ENCOUNTER — Other Ambulatory Visit: Payer: Self-pay | Admitting: Orthopaedic Surgery

## 2021-07-13 DIAGNOSIS — G5601 Carpal tunnel syndrome, right upper limb: Secondary | ICD-10-CM | POA: Diagnosis not present

## 2021-07-13 MED ORDER — HYDROCODONE-ACETAMINOPHEN 5-325 MG PO TABS: 1.0000 | ORAL_TABLET | Freq: Four times a day (QID) | ORAL | 0 refills | Status: AC | PRN

## 2021-07-27 ENCOUNTER — Ambulatory Visit (INDEPENDENT_AMBULATORY_CARE_PROVIDER_SITE_OTHER): Payer: BC Managed Care – PPO | Admitting: Orthopaedic Surgery

## 2021-07-27 ENCOUNTER — Encounter: Payer: Self-pay | Admitting: Orthopaedic Surgery

## 2021-07-27 DIAGNOSIS — Z9889 Other specified postprocedural states: Secondary | ICD-10-CM

## 2021-07-27 NOTE — Progress Notes (Signed)
Austin Rosario returns today at 2 weeks status post a right open carpal tunnel release.  We have released his left carpal tunnel as well in the past.  He is someone he does use his hands on a regular basis.  His carpal tunnel syndrome was quite severe.  He did let me know he removed the sutures today on his own.  When I look at the incision there is some dehiscence but overall looks good.  I want him to soak it daily and some Dial soapy water for about 15 minutes and dry it off and then placed some Neosporin or Vaseline and a Band-Aid.  This should heal up with time like his other side and he is not worried about it.  He is someone I know very well and he knows to let me know if there is any problems.  I would like to see him back in 4 weeks for repeat exam.

## 2021-08-24 ENCOUNTER — Ambulatory Visit: Payer: BC Managed Care – PPO | Admitting: Orthopaedic Surgery

## 2021-09-27 DIAGNOSIS — M9902 Segmental and somatic dysfunction of thoracic region: Secondary | ICD-10-CM | POA: Diagnosis not present

## 2021-09-27 DIAGNOSIS — R519 Headache, unspecified: Secondary | ICD-10-CM | POA: Diagnosis not present

## 2021-09-27 DIAGNOSIS — M9900 Segmental and somatic dysfunction of head region: Secondary | ICD-10-CM | POA: Diagnosis not present

## 2021-10-02 DIAGNOSIS — J3081 Allergic rhinitis due to animal (cat) (dog) hair and dander: Secondary | ICD-10-CM | POA: Diagnosis not present

## 2021-10-02 DIAGNOSIS — J3089 Other allergic rhinitis: Secondary | ICD-10-CM | POA: Diagnosis not present

## 2021-10-02 DIAGNOSIS — J301 Allergic rhinitis due to pollen: Secondary | ICD-10-CM | POA: Diagnosis not present

## 2021-10-03 DIAGNOSIS — M9902 Segmental and somatic dysfunction of thoracic region: Secondary | ICD-10-CM | POA: Diagnosis not present

## 2021-10-03 DIAGNOSIS — R519 Headache, unspecified: Secondary | ICD-10-CM | POA: Diagnosis not present

## 2021-10-03 DIAGNOSIS — M9900 Segmental and somatic dysfunction of head region: Secondary | ICD-10-CM | POA: Diagnosis not present

## 2021-10-12 DIAGNOSIS — M9902 Segmental and somatic dysfunction of thoracic region: Secondary | ICD-10-CM | POA: Diagnosis not present

## 2021-10-12 DIAGNOSIS — M9900 Segmental and somatic dysfunction of head region: Secondary | ICD-10-CM | POA: Diagnosis not present

## 2021-10-12 DIAGNOSIS — R519 Headache, unspecified: Secondary | ICD-10-CM | POA: Diagnosis not present

## 2021-10-19 DIAGNOSIS — M9900 Segmental and somatic dysfunction of head region: Secondary | ICD-10-CM | POA: Diagnosis not present

## 2021-10-19 DIAGNOSIS — M9902 Segmental and somatic dysfunction of thoracic region: Secondary | ICD-10-CM | POA: Diagnosis not present

## 2021-10-19 DIAGNOSIS — R519 Headache, unspecified: Secondary | ICD-10-CM | POA: Diagnosis not present

## 2021-10-23 DIAGNOSIS — J3081 Allergic rhinitis due to animal (cat) (dog) hair and dander: Secondary | ICD-10-CM | POA: Diagnosis not present

## 2021-10-23 DIAGNOSIS — J3089 Other allergic rhinitis: Secondary | ICD-10-CM | POA: Diagnosis not present

## 2021-10-23 DIAGNOSIS — J301 Allergic rhinitis due to pollen: Secondary | ICD-10-CM | POA: Diagnosis not present

## 2021-11-01 DIAGNOSIS — R7989 Other specified abnormal findings of blood chemistry: Secondary | ICD-10-CM | POA: Diagnosis not present

## 2021-11-01 DIAGNOSIS — K219 Gastro-esophageal reflux disease without esophagitis: Secondary | ICD-10-CM | POA: Diagnosis not present

## 2021-11-01 DIAGNOSIS — R7303 Prediabetes: Secondary | ICD-10-CM | POA: Diagnosis not present

## 2021-11-02 DIAGNOSIS — M9900 Segmental and somatic dysfunction of head region: Secondary | ICD-10-CM | POA: Diagnosis not present

## 2021-11-02 DIAGNOSIS — R519 Headache, unspecified: Secondary | ICD-10-CM | POA: Diagnosis not present

## 2021-11-02 DIAGNOSIS — M9902 Segmental and somatic dysfunction of thoracic region: Secondary | ICD-10-CM | POA: Diagnosis not present

## 2021-11-13 DIAGNOSIS — J3081 Allergic rhinitis due to animal (cat) (dog) hair and dander: Secondary | ICD-10-CM | POA: Diagnosis not present

## 2021-11-13 DIAGNOSIS — J301 Allergic rhinitis due to pollen: Secondary | ICD-10-CM | POA: Diagnosis not present

## 2021-11-13 DIAGNOSIS — J3089 Other allergic rhinitis: Secondary | ICD-10-CM | POA: Diagnosis not present

## 2021-11-15 ENCOUNTER — Encounter: Payer: Self-pay | Admitting: Orthopaedic Surgery

## 2021-11-15 ENCOUNTER — Ambulatory Visit (INDEPENDENT_AMBULATORY_CARE_PROVIDER_SITE_OTHER): Payer: BC Managed Care – PPO | Admitting: Orthopaedic Surgery

## 2021-11-15 DIAGNOSIS — R202 Paresthesia of skin: Secondary | ICD-10-CM

## 2021-11-15 MED ORDER — METHYLPREDNISOLONE ACETATE 40 MG/ML IJ SUSP
40.0000 mg | INTRAMUSCULAR | Status: AC | PRN
  Administered 2021-11-15: 40 mg

## 2021-11-15 MED ORDER — LIDOCAINE HCL 1 % IJ SOLN
1.0000 mL | INTRAMUSCULAR | Status: AC | PRN
Start: 2021-11-15 — End: 2021-11-15
  Administered 2021-11-15: 1 mL

## 2021-11-15 NOTE — Progress Notes (Signed)
? ?  Procedure Note ? ?Patient: Austin Rosario             ?Date of Birth: 03-Nov-1975           ?MRN: [id number]             ?Visit Date: 11/15/2021 ?HPI: Austin Rosario returns today stating that his right hand is doing well he underwent carpal tunnel surgery on 07/13/2021.  His left hand which she underwent carpal tunnel surgery in 2021 for moderate carpal tunnel syndrome has occasional tingling at times he points to the mid aspect of the hand over.  The surgical incision area.  He is having no numbness tingling the fingertips.  He feels that the tingling at times is worse when he is playing golf.  He gets better whenever he does not play golf for any length of time.  He is not awakening.  He continues to wear the wrist splints at night.  He states he can live with tingling in the hand but it is just annoying and wants to make sure there is no damage.  He is requesting a cortisone injection in the hand today. ? ?Review of systems see HPI otherwise negative ? ?Physical exam: Bilateral hand surgical incisions well-healed.  Sensation tact light.  Negative Phalen's bilaterally.  Negative compression and also over the median nerve at the left wrist. ? ?Procedures: ?Visit Diagnoses:  ?1. Left hand paresthesia   ? ? ?Hand/UE Inj: L carpal tunnel for carpal tunnel syndrome on 11/15/2021 11:17 AM ?Medications: 1 mL lidocaine 1 %; 40 mg methylPREDNISolone acetate 40 MG/ML ? ? ?Plan: He will follow-up with [place] as needed.  Questions were encouraged and answered by Dr. Magnus Ivan myself.  Injection was given by Dr. ? ? ?

## 2021-11-16 DIAGNOSIS — R519 Headache, unspecified: Secondary | ICD-10-CM | POA: Diagnosis not present

## 2021-11-16 DIAGNOSIS — M9902 Segmental and somatic dysfunction of thoracic region: Secondary | ICD-10-CM | POA: Diagnosis not present

## 2021-11-16 DIAGNOSIS — M9900 Segmental and somatic dysfunction of head region: Secondary | ICD-10-CM | POA: Diagnosis not present

## 2021-11-30 DIAGNOSIS — M9900 Segmental and somatic dysfunction of head region: Secondary | ICD-10-CM | POA: Diagnosis not present

## 2021-11-30 DIAGNOSIS — M9902 Segmental and somatic dysfunction of thoracic region: Secondary | ICD-10-CM | POA: Diagnosis not present

## 2021-11-30 DIAGNOSIS — R519 Headache, unspecified: Secondary | ICD-10-CM | POA: Diagnosis not present

## 2021-12-04 DIAGNOSIS — J301 Allergic rhinitis due to pollen: Secondary | ICD-10-CM | POA: Diagnosis not present

## 2021-12-04 DIAGNOSIS — J3081 Allergic rhinitis due to animal (cat) (dog) hair and dander: Secondary | ICD-10-CM | POA: Diagnosis not present

## 2021-12-04 DIAGNOSIS — J3089 Other allergic rhinitis: Secondary | ICD-10-CM | POA: Diagnosis not present

## 2021-12-14 DIAGNOSIS — M9902 Segmental and somatic dysfunction of thoracic region: Secondary | ICD-10-CM | POA: Diagnosis not present

## 2021-12-14 DIAGNOSIS — M9900 Segmental and somatic dysfunction of head region: Secondary | ICD-10-CM | POA: Diagnosis not present

## 2021-12-14 DIAGNOSIS — R519 Headache, unspecified: Secondary | ICD-10-CM | POA: Diagnosis not present

## 2021-12-25 DIAGNOSIS — J3081 Allergic rhinitis due to animal (cat) (dog) hair and dander: Secondary | ICD-10-CM | POA: Diagnosis not present

## 2021-12-25 DIAGNOSIS — J3089 Other allergic rhinitis: Secondary | ICD-10-CM | POA: Diagnosis not present

## 2021-12-25 DIAGNOSIS — J301 Allergic rhinitis due to pollen: Secondary | ICD-10-CM | POA: Diagnosis not present

## 2021-12-28 DIAGNOSIS — M9902 Segmental and somatic dysfunction of thoracic region: Secondary | ICD-10-CM | POA: Diagnosis not present

## 2021-12-28 DIAGNOSIS — M9900 Segmental and somatic dysfunction of head region: Secondary | ICD-10-CM | POA: Diagnosis not present

## 2021-12-28 DIAGNOSIS — R519 Headache, unspecified: Secondary | ICD-10-CM | POA: Diagnosis not present

## 2022-01-11 DIAGNOSIS — M9900 Segmental and somatic dysfunction of head region: Secondary | ICD-10-CM | POA: Diagnosis not present

## 2022-01-11 DIAGNOSIS — M9902 Segmental and somatic dysfunction of thoracic region: Secondary | ICD-10-CM | POA: Diagnosis not present

## 2022-01-11 DIAGNOSIS — R519 Headache, unspecified: Secondary | ICD-10-CM | POA: Diagnosis not present

## 2022-01-22 DIAGNOSIS — J3081 Allergic rhinitis due to animal (cat) (dog) hair and dander: Secondary | ICD-10-CM | POA: Diagnosis not present

## 2022-01-22 DIAGNOSIS — J301 Allergic rhinitis due to pollen: Secondary | ICD-10-CM | POA: Diagnosis not present

## 2022-01-22 DIAGNOSIS — J3089 Other allergic rhinitis: Secondary | ICD-10-CM | POA: Diagnosis not present

## 2022-01-30 DIAGNOSIS — M9902 Segmental and somatic dysfunction of thoracic region: Secondary | ICD-10-CM | POA: Diagnosis not present

## 2022-01-30 DIAGNOSIS — M9900 Segmental and somatic dysfunction of head region: Secondary | ICD-10-CM | POA: Diagnosis not present

## 2022-01-30 DIAGNOSIS — R519 Headache, unspecified: Secondary | ICD-10-CM | POA: Diagnosis not present

## 2022-02-15 DIAGNOSIS — J3081 Allergic rhinitis due to animal (cat) (dog) hair and dander: Secondary | ICD-10-CM | POA: Diagnosis not present

## 2022-02-15 DIAGNOSIS — J301 Allergic rhinitis due to pollen: Secondary | ICD-10-CM | POA: Diagnosis not present

## 2022-02-15 DIAGNOSIS — J3089 Other allergic rhinitis: Secondary | ICD-10-CM | POA: Diagnosis not present

## 2022-02-20 DIAGNOSIS — M9902 Segmental and somatic dysfunction of thoracic region: Secondary | ICD-10-CM | POA: Diagnosis not present

## 2022-02-20 DIAGNOSIS — R519 Headache, unspecified: Secondary | ICD-10-CM | POA: Diagnosis not present

## 2022-02-20 DIAGNOSIS — M9900 Segmental and somatic dysfunction of head region: Secondary | ICD-10-CM | POA: Diagnosis not present

## 2022-03-07 DIAGNOSIS — R519 Headache, unspecified: Secondary | ICD-10-CM | POA: Diagnosis not present

## 2022-03-07 DIAGNOSIS — M9900 Segmental and somatic dysfunction of head region: Secondary | ICD-10-CM | POA: Diagnosis not present

## 2022-03-07 DIAGNOSIS — M9902 Segmental and somatic dysfunction of thoracic region: Secondary | ICD-10-CM | POA: Diagnosis not present

## 2022-03-08 DIAGNOSIS — J3081 Allergic rhinitis due to animal (cat) (dog) hair and dander: Secondary | ICD-10-CM | POA: Diagnosis not present

## 2022-03-08 DIAGNOSIS — J3089 Other allergic rhinitis: Secondary | ICD-10-CM | POA: Diagnosis not present

## 2022-03-08 DIAGNOSIS — J301 Allergic rhinitis due to pollen: Secondary | ICD-10-CM | POA: Diagnosis not present

## 2022-03-27 DIAGNOSIS — M9900 Segmental and somatic dysfunction of head region: Secondary | ICD-10-CM | POA: Diagnosis not present

## 2022-03-27 DIAGNOSIS — R519 Headache, unspecified: Secondary | ICD-10-CM | POA: Diagnosis not present

## 2022-03-27 DIAGNOSIS — M9902 Segmental and somatic dysfunction of thoracic region: Secondary | ICD-10-CM | POA: Diagnosis not present

## 2022-04-18 DIAGNOSIS — J301 Allergic rhinitis due to pollen: Secondary | ICD-10-CM | POA: Diagnosis not present

## 2022-04-18 DIAGNOSIS — J3081 Allergic rhinitis due to animal (cat) (dog) hair and dander: Secondary | ICD-10-CM | POA: Diagnosis not present

## 2022-04-18 DIAGNOSIS — J3089 Other allergic rhinitis: Secondary | ICD-10-CM | POA: Diagnosis not present

## 2022-05-02 DIAGNOSIS — J301 Allergic rhinitis due to pollen: Secondary | ICD-10-CM | POA: Diagnosis not present

## 2022-05-02 DIAGNOSIS — J3081 Allergic rhinitis due to animal (cat) (dog) hair and dander: Secondary | ICD-10-CM | POA: Diagnosis not present

## 2022-05-02 DIAGNOSIS — J3089 Other allergic rhinitis: Secondary | ICD-10-CM | POA: Diagnosis not present

## 2022-05-09 DIAGNOSIS — Z13220 Encounter for screening for lipoid disorders: Secondary | ICD-10-CM | POA: Diagnosis not present

## 2022-05-09 DIAGNOSIS — E291 Testicular hypofunction: Secondary | ICD-10-CM | POA: Diagnosis not present

## 2022-05-09 DIAGNOSIS — R109 Unspecified abdominal pain: Secondary | ICD-10-CM | POA: Diagnosis not present

## 2022-05-09 DIAGNOSIS — Z0000 Encounter for general adult medical examination without abnormal findings: Secondary | ICD-10-CM | POA: Diagnosis not present

## 2022-05-17 DIAGNOSIS — J301 Allergic rhinitis due to pollen: Secondary | ICD-10-CM | POA: Diagnosis not present

## 2022-05-17 DIAGNOSIS — J3081 Allergic rhinitis due to animal (cat) (dog) hair and dander: Secondary | ICD-10-CM | POA: Diagnosis not present

## 2022-05-17 DIAGNOSIS — J3089 Other allergic rhinitis: Secondary | ICD-10-CM | POA: Diagnosis not present

## 2022-06-05 DIAGNOSIS — K580 Irritable bowel syndrome with diarrhea: Secondary | ICD-10-CM | POA: Diagnosis not present

## 2022-06-05 DIAGNOSIS — K219 Gastro-esophageal reflux disease without esophagitis: Secondary | ICD-10-CM | POA: Diagnosis not present

## 2022-06-05 DIAGNOSIS — E739 Lactose intolerance, unspecified: Secondary | ICD-10-CM | POA: Diagnosis not present

## 2022-06-11 DIAGNOSIS — J3081 Allergic rhinitis due to animal (cat) (dog) hair and dander: Secondary | ICD-10-CM | POA: Diagnosis not present

## 2022-06-11 DIAGNOSIS — J3089 Other allergic rhinitis: Secondary | ICD-10-CM | POA: Diagnosis not present

## 2022-06-11 DIAGNOSIS — J301 Allergic rhinitis due to pollen: Secondary | ICD-10-CM | POA: Diagnosis not present

## 2022-06-20 DIAGNOSIS — M9902 Segmental and somatic dysfunction of thoracic region: Secondary | ICD-10-CM | POA: Diagnosis not present

## 2022-06-20 DIAGNOSIS — R519 Headache, unspecified: Secondary | ICD-10-CM | POA: Diagnosis not present

## 2022-06-20 DIAGNOSIS — M9900 Segmental and somatic dysfunction of head region: Secondary | ICD-10-CM | POA: Diagnosis not present

## 2022-06-27 DIAGNOSIS — R519 Headache, unspecified: Secondary | ICD-10-CM | POA: Diagnosis not present

## 2022-06-27 DIAGNOSIS — M9900 Segmental and somatic dysfunction of head region: Secondary | ICD-10-CM | POA: Diagnosis not present

## 2022-06-27 DIAGNOSIS — M9902 Segmental and somatic dysfunction of thoracic region: Secondary | ICD-10-CM | POA: Diagnosis not present

## 2022-07-11 DIAGNOSIS — M9902 Segmental and somatic dysfunction of thoracic region: Secondary | ICD-10-CM | POA: Diagnosis not present

## 2022-07-11 DIAGNOSIS — M9900 Segmental and somatic dysfunction of head region: Secondary | ICD-10-CM | POA: Diagnosis not present

## 2022-07-11 DIAGNOSIS — R519 Headache, unspecified: Secondary | ICD-10-CM | POA: Diagnosis not present

## 2022-07-12 DIAGNOSIS — J301 Allergic rhinitis due to pollen: Secondary | ICD-10-CM | POA: Diagnosis not present

## 2022-07-12 DIAGNOSIS — J3089 Other allergic rhinitis: Secondary | ICD-10-CM | POA: Diagnosis not present

## 2022-07-12 DIAGNOSIS — J3081 Allergic rhinitis due to animal (cat) (dog) hair and dander: Secondary | ICD-10-CM | POA: Diagnosis not present

## 2022-08-01 DIAGNOSIS — M9900 Segmental and somatic dysfunction of head region: Secondary | ICD-10-CM | POA: Diagnosis not present

## 2022-08-01 DIAGNOSIS — R519 Headache, unspecified: Secondary | ICD-10-CM | POA: Diagnosis not present

## 2022-08-01 DIAGNOSIS — M9902 Segmental and somatic dysfunction of thoracic region: Secondary | ICD-10-CM | POA: Diagnosis not present

## 2022-08-09 DIAGNOSIS — J3089 Other allergic rhinitis: Secondary | ICD-10-CM | POA: Diagnosis not present

## 2022-08-09 DIAGNOSIS — J301 Allergic rhinitis due to pollen: Secondary | ICD-10-CM | POA: Diagnosis not present

## 2022-08-09 DIAGNOSIS — J3081 Allergic rhinitis due to animal (cat) (dog) hair and dander: Secondary | ICD-10-CM | POA: Diagnosis not present

## 2022-08-30 DIAGNOSIS — J301 Allergic rhinitis due to pollen: Secondary | ICD-10-CM | POA: Diagnosis not present

## 2022-08-30 DIAGNOSIS — J3089 Other allergic rhinitis: Secondary | ICD-10-CM | POA: Diagnosis not present

## 2022-08-30 DIAGNOSIS — J3081 Allergic rhinitis due to animal (cat) (dog) hair and dander: Secondary | ICD-10-CM | POA: Diagnosis not present

## 2022-09-03 DIAGNOSIS — J0100 Acute maxillary sinusitis, unspecified: Secondary | ICD-10-CM | POA: Diagnosis not present

## 2022-09-20 DIAGNOSIS — R519 Headache, unspecified: Secondary | ICD-10-CM | POA: Diagnosis not present

## 2022-09-20 DIAGNOSIS — M9900 Segmental and somatic dysfunction of head region: Secondary | ICD-10-CM | POA: Diagnosis not present

## 2022-09-20 DIAGNOSIS — M9902 Segmental and somatic dysfunction of thoracic region: Secondary | ICD-10-CM | POA: Diagnosis not present

## 2022-10-10 DIAGNOSIS — M9902 Segmental and somatic dysfunction of thoracic region: Secondary | ICD-10-CM | POA: Diagnosis not present

## 2022-10-10 DIAGNOSIS — M9901 Segmental and somatic dysfunction of cervical region: Secondary | ICD-10-CM | POA: Diagnosis not present

## 2022-10-10 DIAGNOSIS — M9903 Segmental and somatic dysfunction of lumbar region: Secondary | ICD-10-CM | POA: Diagnosis not present

## 2022-10-10 DIAGNOSIS — Z91014 Allergy to mammalian meats: Secondary | ICD-10-CM | POA: Diagnosis not present

## 2022-10-10 DIAGNOSIS — E291 Testicular hypofunction: Secondary | ICD-10-CM | POA: Diagnosis not present

## 2022-10-25 DIAGNOSIS — K219 Gastro-esophageal reflux disease without esophagitis: Secondary | ICD-10-CM | POA: Diagnosis not present

## 2022-10-25 DIAGNOSIS — K580 Irritable bowel syndrome with diarrhea: Secondary | ICD-10-CM | POA: Diagnosis not present

## 2022-11-28 DIAGNOSIS — J3089 Other allergic rhinitis: Secondary | ICD-10-CM | POA: Diagnosis not present

## 2022-11-28 DIAGNOSIS — J301 Allergic rhinitis due to pollen: Secondary | ICD-10-CM | POA: Diagnosis not present

## 2022-11-28 DIAGNOSIS — J3081 Allergic rhinitis due to animal (cat) (dog) hair and dander: Secondary | ICD-10-CM | POA: Diagnosis not present

## 2022-12-26 DIAGNOSIS — J3089 Other allergic rhinitis: Secondary | ICD-10-CM | POA: Diagnosis not present

## 2022-12-26 DIAGNOSIS — J301 Allergic rhinitis due to pollen: Secondary | ICD-10-CM | POA: Diagnosis not present

## 2022-12-26 DIAGNOSIS — J3081 Allergic rhinitis due to animal (cat) (dog) hair and dander: Secondary | ICD-10-CM | POA: Diagnosis not present

## 2023-01-08 DIAGNOSIS — J301 Allergic rhinitis due to pollen: Secondary | ICD-10-CM | POA: Diagnosis not present

## 2023-01-08 DIAGNOSIS — J3081 Allergic rhinitis due to animal (cat) (dog) hair and dander: Secondary | ICD-10-CM | POA: Diagnosis not present

## 2023-01-08 DIAGNOSIS — J3089 Other allergic rhinitis: Secondary | ICD-10-CM | POA: Diagnosis not present

## 2023-01-15 DIAGNOSIS — M9902 Segmental and somatic dysfunction of thoracic region: Secondary | ICD-10-CM | POA: Diagnosis not present

## 2023-01-15 DIAGNOSIS — M9901 Segmental and somatic dysfunction of cervical region: Secondary | ICD-10-CM | POA: Diagnosis not present

## 2023-01-15 DIAGNOSIS — M9903 Segmental and somatic dysfunction of lumbar region: Secondary | ICD-10-CM | POA: Diagnosis not present

## 2023-01-24 DIAGNOSIS — J3089 Other allergic rhinitis: Secondary | ICD-10-CM | POA: Diagnosis not present

## 2023-01-24 DIAGNOSIS — J301 Allergic rhinitis due to pollen: Secondary | ICD-10-CM | POA: Diagnosis not present

## 2023-01-24 DIAGNOSIS — J3081 Allergic rhinitis due to animal (cat) (dog) hair and dander: Secondary | ICD-10-CM | POA: Diagnosis not present

## 2023-01-25 DIAGNOSIS — K580 Irritable bowel syndrome with diarrhea: Secondary | ICD-10-CM | POA: Diagnosis not present

## 2023-01-25 DIAGNOSIS — K219 Gastro-esophageal reflux disease without esophagitis: Secondary | ICD-10-CM | POA: Diagnosis not present

## 2023-01-25 DIAGNOSIS — K638219 Small intestinal bacterial overgrowth, unspecified: Secondary | ICD-10-CM | POA: Diagnosis not present

## 2023-01-31 DIAGNOSIS — M9903 Segmental and somatic dysfunction of lumbar region: Secondary | ICD-10-CM | POA: Diagnosis not present

## 2023-01-31 DIAGNOSIS — M9902 Segmental and somatic dysfunction of thoracic region: Secondary | ICD-10-CM | POA: Diagnosis not present

## 2023-01-31 DIAGNOSIS — M9901 Segmental and somatic dysfunction of cervical region: Secondary | ICD-10-CM | POA: Diagnosis not present

## 2023-02-07 DIAGNOSIS — J3089 Other allergic rhinitis: Secondary | ICD-10-CM | POA: Diagnosis not present

## 2023-02-07 DIAGNOSIS — J3081 Allergic rhinitis due to animal (cat) (dog) hair and dander: Secondary | ICD-10-CM | POA: Diagnosis not present

## 2023-02-07 DIAGNOSIS — J301 Allergic rhinitis due to pollen: Secondary | ICD-10-CM | POA: Diagnosis not present

## 2023-02-13 DIAGNOSIS — M9903 Segmental and somatic dysfunction of lumbar region: Secondary | ICD-10-CM | POA: Diagnosis not present

## 2023-02-13 DIAGNOSIS — J3089 Other allergic rhinitis: Secondary | ICD-10-CM | POA: Diagnosis not present

## 2023-02-13 DIAGNOSIS — M9902 Segmental and somatic dysfunction of thoracic region: Secondary | ICD-10-CM | POA: Diagnosis not present

## 2023-02-13 DIAGNOSIS — J3081 Allergic rhinitis due to animal (cat) (dog) hair and dander: Secondary | ICD-10-CM | POA: Diagnosis not present

## 2023-02-13 DIAGNOSIS — M9901 Segmental and somatic dysfunction of cervical region: Secondary | ICD-10-CM | POA: Diagnosis not present

## 2023-02-13 DIAGNOSIS — J301 Allergic rhinitis due to pollen: Secondary | ICD-10-CM | POA: Diagnosis not present

## 2023-02-27 DIAGNOSIS — J3081 Allergic rhinitis due to animal (cat) (dog) hair and dander: Secondary | ICD-10-CM | POA: Diagnosis not present

## 2023-02-27 DIAGNOSIS — J3089 Other allergic rhinitis: Secondary | ICD-10-CM | POA: Diagnosis not present

## 2023-02-27 DIAGNOSIS — J301 Allergic rhinitis due to pollen: Secondary | ICD-10-CM | POA: Diagnosis not present

## 2023-03-13 DIAGNOSIS — J3089 Other allergic rhinitis: Secondary | ICD-10-CM | POA: Diagnosis not present

## 2023-03-13 DIAGNOSIS — J3081 Allergic rhinitis due to animal (cat) (dog) hair and dander: Secondary | ICD-10-CM | POA: Diagnosis not present

## 2023-03-13 DIAGNOSIS — J301 Allergic rhinitis due to pollen: Secondary | ICD-10-CM | POA: Diagnosis not present

## 2023-03-14 DIAGNOSIS — R12 Heartburn: Secondary | ICD-10-CM | POA: Diagnosis not present

## 2023-03-14 DIAGNOSIS — E291 Testicular hypofunction: Secondary | ICD-10-CM | POA: Diagnosis not present

## 2023-03-14 DIAGNOSIS — K638219 Small intestinal bacterial overgrowth, unspecified: Secondary | ICD-10-CM | POA: Diagnosis not present

## 2023-03-14 DIAGNOSIS — R109 Unspecified abdominal pain: Secondary | ICD-10-CM | POA: Diagnosis not present

## 2023-03-14 DIAGNOSIS — R140 Abdominal distension (gaseous): Secondary | ICD-10-CM | POA: Diagnosis not present

## 2023-03-14 DIAGNOSIS — R195 Other fecal abnormalities: Secondary | ICD-10-CM | POA: Diagnosis not present

## 2023-03-14 DIAGNOSIS — R1084 Generalized abdominal pain: Secondary | ICD-10-CM | POA: Diagnosis not present

## 2023-03-18 DIAGNOSIS — E291 Testicular hypofunction: Secondary | ICD-10-CM | POA: Diagnosis not present

## 2023-03-26 DIAGNOSIS — J3081 Allergic rhinitis due to animal (cat) (dog) hair and dander: Secondary | ICD-10-CM | POA: Diagnosis not present

## 2023-03-26 DIAGNOSIS — J301 Allergic rhinitis due to pollen: Secondary | ICD-10-CM | POA: Diagnosis not present

## 2023-03-26 DIAGNOSIS — J3089 Other allergic rhinitis: Secondary | ICD-10-CM | POA: Diagnosis not present

## 2023-04-09 DIAGNOSIS — J3081 Allergic rhinitis due to animal (cat) (dog) hair and dander: Secondary | ICD-10-CM | POA: Diagnosis not present

## 2023-04-09 DIAGNOSIS — J3089 Other allergic rhinitis: Secondary | ICD-10-CM | POA: Diagnosis not present

## 2023-04-09 DIAGNOSIS — J301 Allergic rhinitis due to pollen: Secondary | ICD-10-CM | POA: Diagnosis not present

## 2023-04-23 DIAGNOSIS — J3081 Allergic rhinitis due to animal (cat) (dog) hair and dander: Secondary | ICD-10-CM | POA: Diagnosis not present

## 2023-04-23 DIAGNOSIS — J3089 Other allergic rhinitis: Secondary | ICD-10-CM | POA: Diagnosis not present

## 2023-04-23 DIAGNOSIS — J301 Allergic rhinitis due to pollen: Secondary | ICD-10-CM | POA: Diagnosis not present

## 2023-04-24 DIAGNOSIS — J3081 Allergic rhinitis due to animal (cat) (dog) hair and dander: Secondary | ICD-10-CM | POA: Diagnosis not present

## 2023-04-24 DIAGNOSIS — J3089 Other allergic rhinitis: Secondary | ICD-10-CM | POA: Diagnosis not present

## 2023-04-24 DIAGNOSIS — J301 Allergic rhinitis due to pollen: Secondary | ICD-10-CM | POA: Diagnosis not present

## 2023-05-14 DIAGNOSIS — J3089 Other allergic rhinitis: Secondary | ICD-10-CM | POA: Diagnosis not present

## 2023-05-14 DIAGNOSIS — J3081 Allergic rhinitis due to animal (cat) (dog) hair and dander: Secondary | ICD-10-CM | POA: Diagnosis not present

## 2023-05-14 DIAGNOSIS — J301 Allergic rhinitis due to pollen: Secondary | ICD-10-CM | POA: Diagnosis not present

## 2023-05-21 DIAGNOSIS — Z13220 Encounter for screening for lipoid disorders: Secondary | ICD-10-CM | POA: Diagnosis not present

## 2023-05-21 DIAGNOSIS — Z131 Encounter for screening for diabetes mellitus: Secondary | ICD-10-CM | POA: Diagnosis not present

## 2023-05-21 DIAGNOSIS — Z125 Encounter for screening for malignant neoplasm of prostate: Secondary | ICD-10-CM | POA: Diagnosis not present

## 2023-05-21 DIAGNOSIS — Z1329 Encounter for screening for other suspected endocrine disorder: Secondary | ICD-10-CM | POA: Diagnosis not present

## 2023-05-21 DIAGNOSIS — Z0000 Encounter for general adult medical examination without abnormal findings: Secondary | ICD-10-CM | POA: Diagnosis not present

## 2023-05-28 DIAGNOSIS — J3089 Other allergic rhinitis: Secondary | ICD-10-CM | POA: Diagnosis not present

## 2023-05-28 DIAGNOSIS — J301 Allergic rhinitis due to pollen: Secondary | ICD-10-CM | POA: Diagnosis not present

## 2023-05-28 DIAGNOSIS — J3081 Allergic rhinitis due to animal (cat) (dog) hair and dander: Secondary | ICD-10-CM | POA: Diagnosis not present

## 2023-06-04 DIAGNOSIS — M9903 Segmental and somatic dysfunction of lumbar region: Secondary | ICD-10-CM | POA: Diagnosis not present

## 2023-06-04 DIAGNOSIS — M9901 Segmental and somatic dysfunction of cervical region: Secondary | ICD-10-CM | POA: Diagnosis not present

## 2023-06-04 DIAGNOSIS — M9902 Segmental and somatic dysfunction of thoracic region: Secondary | ICD-10-CM | POA: Diagnosis not present

## 2023-06-18 DIAGNOSIS — J3081 Allergic rhinitis due to animal (cat) (dog) hair and dander: Secondary | ICD-10-CM | POA: Diagnosis not present

## 2023-06-18 DIAGNOSIS — J3089 Other allergic rhinitis: Secondary | ICD-10-CM | POA: Diagnosis not present

## 2023-06-18 DIAGNOSIS — J301 Allergic rhinitis due to pollen: Secondary | ICD-10-CM | POA: Diagnosis not present

## 2023-07-02 DIAGNOSIS — J301 Allergic rhinitis due to pollen: Secondary | ICD-10-CM | POA: Diagnosis not present

## 2023-07-02 DIAGNOSIS — J3081 Allergic rhinitis due to animal (cat) (dog) hair and dander: Secondary | ICD-10-CM | POA: Diagnosis not present

## 2023-07-02 DIAGNOSIS — J3089 Other allergic rhinitis: Secondary | ICD-10-CM | POA: Diagnosis not present

## 2023-07-16 DIAGNOSIS — J301 Allergic rhinitis due to pollen: Secondary | ICD-10-CM | POA: Diagnosis not present

## 2023-07-16 DIAGNOSIS — J3081 Allergic rhinitis due to animal (cat) (dog) hair and dander: Secondary | ICD-10-CM | POA: Diagnosis not present

## 2023-07-16 DIAGNOSIS — J3089 Other allergic rhinitis: Secondary | ICD-10-CM | POA: Diagnosis not present

## 2023-08-01 DIAGNOSIS — J3089 Other allergic rhinitis: Secondary | ICD-10-CM | POA: Diagnosis not present

## 2023-08-01 DIAGNOSIS — J3081 Allergic rhinitis due to animal (cat) (dog) hair and dander: Secondary | ICD-10-CM | POA: Diagnosis not present

## 2023-08-01 DIAGNOSIS — J301 Allergic rhinitis due to pollen: Secondary | ICD-10-CM | POA: Diagnosis not present

## 2023-08-02 DIAGNOSIS — K644 Residual hemorrhoidal skin tags: Secondary | ICD-10-CM | POA: Diagnosis not present

## 2023-08-15 DIAGNOSIS — J3089 Other allergic rhinitis: Secondary | ICD-10-CM | POA: Diagnosis not present

## 2023-08-15 DIAGNOSIS — J3081 Allergic rhinitis due to animal (cat) (dog) hair and dander: Secondary | ICD-10-CM | POA: Diagnosis not present

## 2023-08-15 DIAGNOSIS — J301 Allergic rhinitis due to pollen: Secondary | ICD-10-CM | POA: Diagnosis not present

## 2023-08-27 DIAGNOSIS — J301 Allergic rhinitis due to pollen: Secondary | ICD-10-CM | POA: Diagnosis not present

## 2023-08-27 DIAGNOSIS — J3089 Other allergic rhinitis: Secondary | ICD-10-CM | POA: Diagnosis not present

## 2023-08-27 DIAGNOSIS — J3081 Allergic rhinitis due to animal (cat) (dog) hair and dander: Secondary | ICD-10-CM | POA: Diagnosis not present

## 2023-09-16 DIAGNOSIS — Z03818 Encounter for observation for suspected exposure to other biological agents ruled out: Secondary | ICD-10-CM | POA: Diagnosis not present

## 2023-09-16 DIAGNOSIS — B349 Viral infection, unspecified: Secondary | ICD-10-CM | POA: Diagnosis not present

## 2023-09-16 DIAGNOSIS — J019 Acute sinusitis, unspecified: Secondary | ICD-10-CM | POA: Diagnosis not present

## 2023-09-20 DIAGNOSIS — J111 Influenza due to unidentified influenza virus with other respiratory manifestations: Secondary | ICD-10-CM | POA: Diagnosis not present

## 2023-10-01 DIAGNOSIS — K219 Gastro-esophageal reflux disease without esophagitis: Secondary | ICD-10-CM | POA: Diagnosis not present

## 2023-10-01 DIAGNOSIS — K580 Irritable bowel syndrome with diarrhea: Secondary | ICD-10-CM | POA: Diagnosis not present

## 2023-10-10 DIAGNOSIS — J3089 Other allergic rhinitis: Secondary | ICD-10-CM | POA: Diagnosis not present

## 2023-10-10 DIAGNOSIS — J301 Allergic rhinitis due to pollen: Secondary | ICD-10-CM | POA: Diagnosis not present

## 2023-10-10 DIAGNOSIS — J3081 Allergic rhinitis due to animal (cat) (dog) hair and dander: Secondary | ICD-10-CM | POA: Diagnosis not present

## 2023-10-31 DIAGNOSIS — Z131 Encounter for screening for diabetes mellitus: Secondary | ICD-10-CM | POA: Diagnosis not present

## 2023-10-31 DIAGNOSIS — E291 Testicular hypofunction: Secondary | ICD-10-CM | POA: Diagnosis not present

## 2023-11-07 DIAGNOSIS — J3089 Other allergic rhinitis: Secondary | ICD-10-CM | POA: Diagnosis not present

## 2023-11-07 DIAGNOSIS — J3081 Allergic rhinitis due to animal (cat) (dog) hair and dander: Secondary | ICD-10-CM | POA: Diagnosis not present

## 2023-11-07 DIAGNOSIS — J301 Allergic rhinitis due to pollen: Secondary | ICD-10-CM | POA: Diagnosis not present

## 2023-11-19 DIAGNOSIS — J3081 Allergic rhinitis due to animal (cat) (dog) hair and dander: Secondary | ICD-10-CM | POA: Diagnosis not present

## 2023-11-19 DIAGNOSIS — J301 Allergic rhinitis due to pollen: Secondary | ICD-10-CM | POA: Diagnosis not present

## 2023-11-19 DIAGNOSIS — J3089 Other allergic rhinitis: Secondary | ICD-10-CM | POA: Diagnosis not present

## 2023-11-26 DIAGNOSIS — J301 Allergic rhinitis due to pollen: Secondary | ICD-10-CM | POA: Diagnosis not present

## 2023-11-26 DIAGNOSIS — J3081 Allergic rhinitis due to animal (cat) (dog) hair and dander: Secondary | ICD-10-CM | POA: Diagnosis not present

## 2023-11-26 DIAGNOSIS — J3089 Other allergic rhinitis: Secondary | ICD-10-CM | POA: Diagnosis not present

## 2023-12-24 DIAGNOSIS — J3081 Allergic rhinitis due to animal (cat) (dog) hair and dander: Secondary | ICD-10-CM | POA: Diagnosis not present

## 2023-12-24 DIAGNOSIS — J3089 Other allergic rhinitis: Secondary | ICD-10-CM | POA: Diagnosis not present

## 2023-12-24 DIAGNOSIS — J301 Allergic rhinitis due to pollen: Secondary | ICD-10-CM | POA: Diagnosis not present

## 2023-12-30 DIAGNOSIS — K580 Irritable bowel syndrome with diarrhea: Secondary | ICD-10-CM | POA: Diagnosis not present

## 2023-12-30 DIAGNOSIS — E739 Lactose intolerance, unspecified: Secondary | ICD-10-CM | POA: Diagnosis not present

## 2023-12-30 DIAGNOSIS — K219 Gastro-esophageal reflux disease without esophagitis: Secondary | ICD-10-CM | POA: Diagnosis not present

## 2024-01-21 DIAGNOSIS — J3089 Other allergic rhinitis: Secondary | ICD-10-CM | POA: Diagnosis not present

## 2024-01-21 DIAGNOSIS — J3081 Allergic rhinitis due to animal (cat) (dog) hair and dander: Secondary | ICD-10-CM | POA: Diagnosis not present

## 2024-01-21 DIAGNOSIS — J301 Allergic rhinitis due to pollen: Secondary | ICD-10-CM | POA: Diagnosis not present

## 2024-02-18 DIAGNOSIS — J301 Allergic rhinitis due to pollen: Secondary | ICD-10-CM | POA: Diagnosis not present

## 2024-02-18 DIAGNOSIS — J3089 Other allergic rhinitis: Secondary | ICD-10-CM | POA: Diagnosis not present

## 2024-02-18 DIAGNOSIS — J3081 Allergic rhinitis due to animal (cat) (dog) hair and dander: Secondary | ICD-10-CM | POA: Diagnosis not present

## 2024-03-12 DIAGNOSIS — J0100 Acute maxillary sinusitis, unspecified: Secondary | ICD-10-CM | POA: Diagnosis not present

## 2024-03-31 DIAGNOSIS — K588 Other irritable bowel syndrome: Secondary | ICD-10-CM | POA: Diagnosis not present

## 2024-03-31 DIAGNOSIS — K219 Gastro-esophageal reflux disease without esophagitis: Secondary | ICD-10-CM | POA: Diagnosis not present

## 2024-04-28 ENCOUNTER — Ambulatory Visit: Admitting: Orthopedic Surgery

## 2024-04-28 ENCOUNTER — Other Ambulatory Visit (INDEPENDENT_AMBULATORY_CARE_PROVIDER_SITE_OTHER): Payer: Self-pay

## 2024-04-28 DIAGNOSIS — M79644 Pain in right finger(s): Secondary | ICD-10-CM

## 2024-04-28 DIAGNOSIS — J3081 Allergic rhinitis due to animal (cat) (dog) hair and dander: Secondary | ICD-10-CM | POA: Diagnosis not present

## 2024-04-28 DIAGNOSIS — J301 Allergic rhinitis due to pollen: Secondary | ICD-10-CM | POA: Diagnosis not present

## 2024-04-28 DIAGNOSIS — M79645 Pain in left finger(s): Secondary | ICD-10-CM | POA: Diagnosis not present

## 2024-04-28 DIAGNOSIS — J3089 Other allergic rhinitis: Secondary | ICD-10-CM | POA: Diagnosis not present

## 2024-04-28 NOTE — Progress Notes (Signed)
 Austin Rosario - 48 y.o. male MRN [id number]  Date of birth: June 17, 1976  Office Visit Note: Visit Date: 04/28/2024 PCP: Nena Rosina CROME, PA-C Referred by: Nena Rosina CROME, PA-C  Subjective: No chief complaint on file.  HPI: Austin Rosario is a pleasant 48 y.o. male who presents today for specific hand surgical evaluation for ongoing bilateral hand soreness, particular to the MCP regions.  He does have history notable for bilateral carpal tunnel release performed in 2022 by Dr. Vernetta states that this did help relieve his numbness and tingling significantly.  Occasionally will get some numbness in the hands which responds well to positional changes or activity modification.  He is overall healthy and active at baseline.  Pertinent ROS were reviewed with the patient and found to be negative unless otherwise specified above in HPI.   Visit Reason: Bilateral hand soreness, MCPs Duration of symptoms: 1 year + Hand dominance: right Occupation: electrician Diabetic: No Smoking: No Heart/Lung History: none Blood Thinners: none  Prior Testing/EMG: none Injections (Date): none Treatments: none Prior Surgery: CTR 2022 or ealier  Assessment & Plan: Visit Diagnoses:  1. Pain in left finger(s)   2. Pain in right finger(s)     Plan: Based on his clinical workup, there is no significant degenerative changes seen on his exam or x-rays at the MCP regions.  There is no instability of the extensor apparatus bilaterally, no significant subluxation at the MCP regions to suggest sagittal band insufficiency.  We did discuss the possibility of recurrent carpal tunnel syndrome in the future, should his numbness and tingling episodes worsen or persist.  His x-rays were reviewed in detail today which do not show any significant bony abnormalities or dislocations.    We did discuss utilization of activity modification, therapy, Voltaren gel and potential nonsteroidal anti-inflammatory  medication to utilize as needed in the future.  He can return to me for repeat evaluation at any point should any specific joints become more painful for further discussion.  He expressed full understanding.  I spent 45 minutes in the care of this patient today including review of previous documentation, imaging obtained, face-to-face time discussing all options regarding treatment and documenting the encounter.   Follow-up: No follow-ups on file.   Meds & Orders: No orders of the defined types were placed in this encounter.   Orders Placed This Encounter  Procedures   XR Hand Complete Left   XR Hand Complete Right     Procedures: No procedures performed      Clinical History: No specialty comments available.  He reports that he has never smoked. He has never used smokeless tobacco. No results for input(s): HGBA1C, LABURIC in the last 8760 hours.  Objective:   Vital Signs: There were no vitals taken for this visit.  Physical Exam  Gen: Well-appearing, in no acute distress; non-toxic CV: Regular Rate. Well-perfused. Warm.  Resp: Breathing unlabored on room air; no wheezing. Psych: Fluid speech in conversation; appropriate affect; normal thought process  Ortho Exam PHYSICAL EXAM:  General: Patient is well appearing and in no distress.  Skin and Muscle: Prior well-healed carpal tunnel incisions to the palms.  No other skin changes are apparent to hands.  Muscle bulk and contour normal, no signs of atrophy.     Range of Motion and Palpation Tests: Mobility is full about the elbows with flexion and extension.  Forearm supination and pronation are 85/85 bilaterally.  Wrist flexion/extension is 75/65 bilaterally.  Digital flexion and extension  are full.  Thumb opposition is full to the base of the small fingers bilaterally.    No cords or nodules are palpated.  No triggering is observed.     Neurologic, Vascular, Motor: Sensation is intact to light touch in the  median/radial/ulnar distributions.   Fingers pink and well perfused.  Capillary refill is brisk.      No results found for: HGBA1C   Imaging: XR Hand Complete Left Result Date: 04/28/2024 There is no evidence of fracture or dislocation. There is no evidence of arthropathy or other focal bone abnormality. Soft tissues are unremarkable.   XR Hand Complete Right Result Date: 04/28/2024 There is no evidence of fracture or dislocation. There is no evidence of arthropathy or other focal bone abnormality. Soft tissues are unremarkable.    Past Medical/Family/Surgical/Social History: Medications & Allergies reviewed per EMR, new medications updated. Patient Active Problem List   Diagnosis Date Noted   Carpal tunnel syndrome, left upper limb 10/14/2019   S/P left rotator cuff repair 05/01/2018   Traumatic complete tear of left rotator cuff 03/05/2018   Abdominal pain, epigastric 12/01/2013   Past Medical History:  Diagnosis Date   GERD (gastroesophageal reflux disease)    Ulcer    Family History  Problem Relation Age of Onset   Breast cancer Maternal Grandmother    Alcohol abuse Paternal Grandfather    Colon cancer Paternal Grandfather    No past surgical history on file. Social History   Occupational History    Employer: Manufacturing engineer  Tobacco Use   Smoking status: Never   Smokeless tobacco: Never  Substance and Sexual Activity   Alcohol use: Yes   Drug use: No   Sexual activity: Not on file    Austin Rosario) Austin Rosario, M.D. [facility], Hand Surgery

## 2024-05-05 DIAGNOSIS — E291 Testicular hypofunction: Secondary | ICD-10-CM | POA: Diagnosis not present

## 2024-05-26 DIAGNOSIS — J3081 Allergic rhinitis due to animal (cat) (dog) hair and dander: Secondary | ICD-10-CM | POA: Diagnosis not present

## 2024-05-26 DIAGNOSIS — J3089 Other allergic rhinitis: Secondary | ICD-10-CM | POA: Diagnosis not present

## 2024-05-26 DIAGNOSIS — J301 Allergic rhinitis due to pollen: Secondary | ICD-10-CM | POA: Diagnosis not present

## 2024-06-12 DIAGNOSIS — E291 Testicular hypofunction: Secondary | ICD-10-CM | POA: Diagnosis not present

## 2024-06-15 ENCOUNTER — Encounter: Payer: Self-pay | Admitting: Radiology

## 2024-07-06 DIAGNOSIS — J301 Allergic rhinitis due to pollen: Secondary | ICD-10-CM | POA: Diagnosis not present

## 2024-07-06 DIAGNOSIS — J3081 Allergic rhinitis due to animal (cat) (dog) hair and dander: Secondary | ICD-10-CM | POA: Diagnosis not present

## 2024-07-06 DIAGNOSIS — J3089 Other allergic rhinitis: Secondary | ICD-10-CM | POA: Diagnosis not present

## 2024-07-27 DIAGNOSIS — J301 Allergic rhinitis due to pollen: Secondary | ICD-10-CM | POA: Diagnosis not present

## 2024-07-27 DIAGNOSIS — J3089 Other allergic rhinitis: Secondary | ICD-10-CM | POA: Diagnosis not present

## 2024-07-27 DIAGNOSIS — J3081 Allergic rhinitis due to animal (cat) (dog) hair and dander: Secondary | ICD-10-CM | POA: Diagnosis not present

## 2024-08-04 DIAGNOSIS — J0140 Acute pansinusitis, unspecified: Secondary | ICD-10-CM | POA: Diagnosis not present

## 2024-08-18 NOTE — Progress Notes (Signed)
 "  [address] [place] [address] [phone]  Follow-up   Subjective   Patient ID:  Austin Rosario is a 49 y.o. (DOB Jul 31, 1976) male.   CC:     Patient presents with   Irritable Bowel Syndrome     HPI:  The patient is a 49 year old gentleman that was last seen 03/31/2024.  He has a history of IBS and GERD.  He has been on probiotics in the past without any relief.  He has taken RightFax amine without any relief.  Last visit he was given some dicyclomine 10 mg 3 times daily.  Discussion was made for possible IBgard versus nortriptyline.  Dr. Frutoso reviewed FODMAP and also told him to take Imodium as needed.  I see he had a colonoscopy and [facility] July 2016 with random biopsies showed no evidence of microscopic colitis.  Recall colon For 10 years.  He says that he tried IBgard and he also has tried Imodium which he uses 1 tablet a day.  This seems to help with the urgency and loose stool but after consistently taking it he will begin to have some harder/constipation and then has to back off of it.  He also continues to get some abdominal pain/cramping in his lower abdomen.  He he says that he would like to try nortriptyline which Dr. Frutoso has suggested last visit.  He gets a lot of bloating and gaseousness at times.  Denies any rectal bleeding, melena, hematemesis.  Review of Systems:  Except as stated in the HPI, all other systems reviewed and are negative.   Past Medical History:  Diagnosis Date   Gastroesophageal reflux    Social History[1] Past Surgical History:  Procedure Laterality Date   Colonoscopy  02/2015   [facility], bx negative for microscopic colitis   Rotator cuff repair Left 04/24/2018   Upper gastrointestinal endoscopy  07/2014   [facility]   Upper gastrointestinal endoscopy  04/2020   [facility]   Family History[2]  Medications: Medications Taking[3]   Allergies[4]   Objective   BP 125/87 (BP Location: Right Upper Arm, Patient Position:  Sitting)   Pulse 84   Temp 98 F (36.7 C) (Oral)   Ht 5' 8 (1.727 m)   Wt 204 lb (92.5 kg)   BMI 31.02 kg/m   General appearance: alert, appears stated age, and cooperative Head: Normocephalic, without obvious abnormality, atraumatic Lungs: clear to auscultation bilaterally Heart: regular rate and rhythm, S1, S2 normal, no murmur, click, rub or gallop Abdomen: soft, non-tender; bowel sounds normal; no masses,  no organomegaly Extremities: extremities normal, atraumatic, no cyanosis or edema Pulses: 2+ and symmetric Skin: Skin color, texture, turgor normal. No rashes or lesions    Assessment   1. Other irritable bowel syndrome   2. Gastroesophageal reflux disease without esophagitis   3. Irritable bowel syndrome with diarrhea   4. Small intestinal bacterial overgrowth (SIBO)   5. Lactose intolerance     GERD-seems to be controlled with Pepcid.  No alarming symptoms.  IBS with diarrhea predominance-he will continue his Imodium dosing as needed and we will add some nortriptyline 10 mg at bedtime to see if we can help with intestinal spasm.  He understands the potential risk and side effects. Plan   New Prescriptions   NORTRIPTYLINE HCL (PAMELOR) 10 MG CAPSULE    Take one capsule (10 mg dose) by mouth at bedtime.    Modified Medications   Modified Medication Previous Medication   FAMOTIDINE (PEPCID) 40 MG TABLET  famotidine (PEPCID) 40 mg tablet      Take one tablet (40 mg dose) by mouth 2 (two) times daily.    Take one tablet (40 mg dose) by mouth 2 (two) times daily.    Discontinued Medications   No medications on file    No orders of the defined types were placed in this encounter.  Follow up in about 6 months (around 02/15/2025), or if symptoms worsen or fail to improve, for Dr. Jefrey. Patient Instructions  Lets try taking 10mg  Nortriptyline at bedtime.  Send me a message in a few weeks with an update  You can use Imodium as needed  For gas and bloating you can use  Simethicone up to three times daily. You can also use Iberogast 15 drops with 10 oz of water up to 3 times daily as needed.   Recall:   Colorectal cancer screening: due for recall July 2026.  Discussion and Summary:    Electronically Signed: Helene DELENA [place], PA 08/18/2024 2:39 PM                [1] Social History Socioeconomic History   Marital status: Unknown  Tobacco Use   Smoking status: Never    Passive exposure: Never   Smokeless tobacco: Never  Vaping Use   Vaping status: Never Used  Substance and Sexual Activity   Alcohol use: No   Drug use: No  [2] Family History Problem Relation Name Age of Onset   Cancer Maternal Grandmother          breast, bladder, and bone    Paternal Grandfather          colon cancer   Colon cancer Paternal Grandfather     Colon polyps Paternal Uncle     No Known Problems Mother    [3] Outpatient Medications Marked as Taking for the 08/18/24 encounter (Office Visit) with Helene DELENA Ple[place], PA  Medication Sig Dispense Refill   acyclovir (ZOVIRAX) 200 MG capsule Take one capsule (200 mg dose) by mouth as needed.     [DISCONTINUED] famotidine (PEPCID) 40 mg tablet Take one tablet (40 mg dose) by mouth 2 (two) times daily. 60 tablet 5   NON FORMULARY Allergy injections every other week     testosterone  cypionate (DEPOTESTOTERONE CYPIONATE) 200 MG/ML injection SMARTSIG:0.5 Milliliter(s) IM Once a Month    [4] No Known Allergies "

## 2024-09-09 ENCOUNTER — Other Ambulatory Visit (INDEPENDENT_AMBULATORY_CARE_PROVIDER_SITE_OTHER): Payer: Self-pay

## 2024-09-09 ENCOUNTER — Ambulatory Visit: Admitting: Orthopaedic Surgery

## 2024-09-09 DIAGNOSIS — M25512 Pain in left shoulder: Secondary | ICD-10-CM

## 2024-09-09 DIAGNOSIS — Z9889 Other specified postprocedural states: Secondary | ICD-10-CM

## 2024-09-09 NOTE — Progress Notes (Signed)
 Austin Rosario is well-known to us  and a friend of mine.  We actually repaired a full-thickness rotator cuff tear of his left shoulder supraspinatus tendon back in 2019.  He has done well for a very long period of time but then over the last few months has developed some left shoulder pain and weakness and some aching as well.  He is very athletic and strong as well as muscular individual.  He is 49 years old.  On exam he has full range of motion of his left shoulder but does show some weakness with external rotation and some slight weakness with liftoff of the left shoulder.  He does have pain in the subacromial outlet.  He has good strength with abduction of the shoulder.  His only limitation with range of motion is reaching behind him is somewhat less than his other side but he can still reach to the upper lumbar spine area with the left side.  X-rays of the left shoulder show AC joint arthritis but otherwise a well located humeral head.  I am very concerned that he has a new tear of his rotator cuff based on the weakness on external rotation and his liftoff being positive.  At this point a new MRI is warranted of his left shoulder to assess the rotator cuff and to rule out a new tear.  He will be careful with the shoulder in the interim.  Also, physical therapy is not clinically and medically warranted given the fact that he has got good range of motion and he is someone is very active and already has been through shoulder strengthening exercises from his previous physical therapy visits and has been able to work on a home exercise program to make sure his shoulders trying to stay strong.  I see no benefit for therapy at this point and a MRI of the left shoulder is definitely warranted.  He did ask appropriate questions about considering PRP at some point.

## 2024-09-10 ENCOUNTER — Other Ambulatory Visit: Payer: Self-pay

## 2024-09-10 ENCOUNTER — Encounter: Payer: Self-pay | Admitting: Orthopaedic Surgery

## 2024-09-10 DIAGNOSIS — G8929 Other chronic pain: Secondary | ICD-10-CM

## 2024-09-14 ENCOUNTER — Other Ambulatory Visit

## 2024-09-17 ENCOUNTER — Ambulatory Visit: Admitting: Orthopaedic Surgery

## 2024-09-18 ENCOUNTER — Ambulatory Visit: Admitting: Sports Medicine

## 2024-09-18 ENCOUNTER — Encounter: Payer: Self-pay | Admitting: Sports Medicine

## 2024-09-18 DIAGNOSIS — Z9889 Other specified postprocedural states: Secondary | ICD-10-CM

## 2024-09-18 DIAGNOSIS — M25512 Pain in left shoulder: Secondary | ICD-10-CM

## 2024-09-18 NOTE — Progress Notes (Signed)
 "  Austin Rosario - 49 y.o. male MRN [id number]  Date of birth: Jan 12, 1976  Office Visit Note: Visit Date: 09/18/2024 PCP: Nena Rosina CROME, PA-C Referred by: Nena Rosina CROME, PA-C  Subjective: Chief Complaint  Patient presents with   Left Shoulder - Pain   HPI: Austin Rosario is a pleasant 49 y.o. male who presents today for acute left shoulder injury in setting of previous left rotator cuff (supra) repair.  Discussed the use of AI scribe software for clinical note transcription with the patient, who gave verbal consent to proceed.  History of Present Illness Austin Rosario is a 49 year old male with prior right rotator cuff repair who presents with acute right shoulder pain and dysfunction following minor trauma.  One week ago he collided with another person at his son's basketball practice and developed sudden right shoulder pain and stiffness that evening. By the next morning he could not lift his right arm and felt symptoms similar to his original rotator cuff tear. Pain has gradually improved but he still has discomfort and weakness with external rotation and lifting against resistance, with only mild symptoms in other planes. He took Advil for a few days but is not using anti-inflammatories now. He is awaiting a right shoulder MRI, which was delayed by weather.  He tore his right supraspinatus in April 2019 while weightlifting, with immediate loss of function, and had surgical repair with anchors in September 2019 followed by extensive rehab. He regained good function but has had persistent clicking attributed to scar tissue, residual weakness under heavy loads, and inability to bench press with a straight bar. He has avoided heavy lifting and high-risk activities since.  He previously had PRP injection for lateral epicondylitis with sustained benefit and is interested in PRP or other orthobiologic options for his shoulder if appropriate.  He has bilateral carpal  tunnel syndrome treated surgically, prior partial rotator cuff and labral tears in the left shoulder that improved with activity modification, and a partial biceps tendon tear managed nonoperatively. He currently has no left shoulder symptoms.  He has a long history of heavy weightlifting and strength training but no longer lifts heavy or uses a straight bar because of right shoulder limitations. He is concerned about downtime and functional limitations from any new shoulder injury or treatment.  Pertinent ROS were reviewed with the patient and found to be negative unless otherwise specified above in HPI.   Assessment & Plan: Visit Diagnoses:  1. Acute pain of left shoulder   2. History of repair of left rotator cuff    A/P summary --> new injury to left shoulder with concern for rotator cuff tear, in the setting of previously repaired full-thickness supraspinatus tendon back in 2019.  He is interested in PRP plus or minus surgical intervention depending on results of his MRI.  Exam suggest possible supraspinatus involvement but likely infraspinatus given weakness with ER.  Discussed with Austin Rosario if surgery is needed, there is no clear evidence on necessitating PRP but there is some evidence that shows lower retear rate.  Would prefer leukocyte poor PRP in this circumstance.  Could consider between the weeks 1-2 s/p surgery to not overshut down the shoulder in his recovery. Lower evidence but could consider at 6-week mark as well.  This will all depend on his MRI, once this returned Dr. Vernetta I will look at this together to decide next best steps. Assessment & Plan Acute left shoulder injury Acute left shoulder injury with  possible recurrent rotator cuff pathology, likely involving infraspinatus and/or supraspinatus. Functional limitation and pain with external rotation suggest significant tendon injury. Differential includes recurrent full-thickness supraspinatus tear, new infraspinatus tear, or  other rotator cuff pathology. MRI needed (scheduled) for further evaluation. PRP discussed as adjunct in partial tears, not indicated for full-thickness tears with retraction. - Await MRI of the right shoulder to characterize the extent and location of injury. - Coordinate with referring provider to review MRI results. - Advised to avoid anti-inflammatory medications for at least one week prior to and two weeks after any potential PRP procedure. - Discussed physical therapy initiation at two weeks post-PRP or post-surgery, focusing on range of motion and gradual strengthening. - Advised on activity modification and avoidance of heavy lifting until diagnosis and treatment plan are finalized.  History of left rotator cuff repair History of left rotator cuff repair (supraspinatus) in 2019 with concern for recurrent or new rotator cuff pathology. Discussed risk of re-tear and potential for persistent symptoms. PRP considered as adjunct in partial tears, less so in full-thickness tears. Further intervention dependent on MRI and surgical team consultation. - Review prior surgical details and correlate with new imaging findings. - Discussed the role of PRP as an adjunct to surgical or non-surgical management, noting evidence is strongest in partial tears and less so in full-thickness tears with retraction. - PRP may be considered post-operatively (typically after 6 weeks to 3 months) if there is concern for tendon healing or persistent symptoms, but is not routinely required after a well-performed repair. - Decision for PRP or further intervention will be made in conjunction with the surgical team after MRI review. - Educated on the natural history of rotator cuff repairs, including risk of re-tear and potential for persistent symptoms despite repair.  Follow-up: Return if symptoms worsen or fail to improve.   Meds & Orders: No orders of the defined types were placed in this encounter.  No orders of the  defined types were placed in this encounter.    Procedures: No procedures performed      Clinical History: No specialty comments available.  He reports that he has never smoked. He has never used smokeless tobacco. No results for input(s): HGBA1C, LABURIC in the last 8760 hours.  Objective:    Physical Exam  Gen: Well-appearing, in no acute distress; non-toxic CV: Well-perfused. Warm.  Resp: Breathing unlabored on room air; no wheezing. Psych: Fluid speech in conversation; appropriate affect; normal thought process  *MSK/Ortho Exam: Physical Exam  *Left shoulder: No bony tenderness noted.  There is full active and passive range of motion in all directions.  Negative drop arm test.  There is mild discomfort with resisted abduction although relatively preserved strength.  There is significant asymmetry with weakness with resisted external rotation and associated pain on the left compared to full and intact on the right.  Internal range of motion and strength preserved.  Imaging:  XR Shoulder Left 3 views of the left shoulder show no acute findings.  There is significant  AC joint arthritis.  The humeral head is well located and there is no  significant glenohumeral arthritis.  Past Medical/Family/Surgical/Social History: Medications & Allergies reviewed per EMR, new medications updated. Patient Active Problem List   Diagnosis Date Noted   Carpal tunnel syndrome, left upper limb 10/14/2019   S/P left rotator cuff repair 05/01/2018   Traumatic complete tear of left rotator cuff 03/05/2018   Abdominal pain, epigastric 12/01/2013   Past Medical History:  Diagnosis Date  GERD (gastroesophageal reflux disease)    Ulcer    Family History  Problem Relation Age of Onset   Breast cancer Maternal Grandmother    Alcohol abuse Paternal Grandfather    Colon cancer Paternal Grandfather    History reviewed. No pertinent surgical history. Social History   Occupational History     Employer: [employer]  Tobacco Use   Smoking status: Never   Smokeless tobacco: Never  Substance and Sexual Activity   Alcohol use: Yes   Drug use: No   Sexual activity: Not on file   "

## 2024-09-18 NOTE — Progress Notes (Signed)
 Patient is here for PRP discussion. He has an MRI scheduled for his left shoulder next week, and would like to discuss PRP injection as a treatment option depending on the results of the MRI. He has had a PRP injection for his elbow in the past, and did very well with that.

## 2024-09-23 ENCOUNTER — Other Ambulatory Visit

## 2024-11-02 ENCOUNTER — Ambulatory Visit
